# Patient Record
Sex: Male | Born: 1965
Health system: Southern US, Community
[De-identification: ages and names within clinical notes are randomized; demographics above are authoritative.]

## PROBLEM LIST (undated history)

## (undated) DIAGNOSIS — B019 Varicella without complication: Secondary | ICD-10-CM

## (undated) DIAGNOSIS — L03317 Cellulitis of buttock: Secondary | ICD-10-CM

## (undated) DIAGNOSIS — L0231 Cutaneous abscess of buttock: Secondary | ICD-10-CM

## (undated) DIAGNOSIS — J42 Unspecified chronic bronchitis: Secondary | ICD-10-CM

## (undated) HISTORY — DX: Varicella without complication: B01.9

---
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
## 2014-09-28 HISTORY — PX: UMBILICAL HERNIA REPAIR: SHX196

## 2018-01-10 ENCOUNTER — Inpatient Hospital Stay (HOSPITAL_COMMUNITY)
Admission: EM | Admit: 2018-01-10 | Discharge: 2018-01-13 | DRG: 872 | Disposition: A | Discharge status: Home / Self Care | Payer: BLUE CROSS/BLUE SHIELD | Attending: Internal Medicine | Admitting: Internal Medicine

## 2018-01-10 ENCOUNTER — Ambulatory Visit: Payer: BLUE CROSS/BLUE SHIELD | Admitting: Family Medicine

## 2018-01-10 ENCOUNTER — Emergency Department (HOSPITAL_COMMUNITY): Payer: BLUE CROSS/BLUE SHIELD

## 2018-01-10 ENCOUNTER — Encounter: Payer: Self-pay | Admitting: Family Medicine

## 2018-01-10 ENCOUNTER — Encounter (HOSPITAL_COMMUNITY): Payer: Self-pay | Admitting: Emergency Medicine

## 2018-01-10 ENCOUNTER — Other Ambulatory Visit: Payer: Self-pay

## 2018-01-10 VITALS — BP 134/90 | HR 115 | Temp 98.3°F | Ht 67.0 in | Wt 253.0 lb

## 2018-01-10 DIAGNOSIS — A419 Sepsis, unspecified organism: Secondary | ICD-10-CM | POA: Diagnosis not present

## 2018-01-10 DIAGNOSIS — Z87891 Personal history of nicotine dependence: Secondary | ICD-10-CM

## 2018-01-10 DIAGNOSIS — Z7689 Persons encountering health services in other specified circumstances: Secondary | ICD-10-CM | POA: Diagnosis not present

## 2018-01-10 DIAGNOSIS — R000 Tachycardia, unspecified: Secondary | ICD-10-CM | POA: Diagnosis present

## 2018-01-10 DIAGNOSIS — Z8261 Family history of arthritis: Secondary | ICD-10-CM

## 2018-01-10 DIAGNOSIS — L03317 Cellulitis of buttock: Secondary | ICD-10-CM | POA: Diagnosis not present

## 2018-01-10 DIAGNOSIS — D649 Anemia, unspecified: Secondary | ICD-10-CM

## 2018-01-10 DIAGNOSIS — L0231 Cutaneous abscess of buttock: Secondary | ICD-10-CM

## 2018-01-10 DIAGNOSIS — Z6841 Body Mass Index (BMI) 40.0 and over, adult: Secondary | ICD-10-CM

## 2018-01-10 DIAGNOSIS — R739 Hyperglycemia, unspecified: Secondary | ICD-10-CM | POA: Diagnosis present

## 2018-01-10 DIAGNOSIS — Z713 Dietary counseling and surveillance: Secondary | ICD-10-CM

## 2018-01-10 DIAGNOSIS — Z833 Family history of diabetes mellitus: Secondary | ICD-10-CM

## 2018-01-10 HISTORY — DX: Cellulitis of buttock: L03.317

## 2018-01-10 HISTORY — DX: Unspecified chronic bronchitis: J42

## 2018-01-10 HISTORY — DX: Cutaneous abscess of buttock: L02.31

## 2018-01-10 LAB — CBC WITH DIFFERENTIAL/PLATELET
Band Neutrophils: 2 %
Basophils Absolute: 0 10*3/uL (ref 0.0–0.1)
Basophils Relative: 0 %
Blasts: 0 %
Eosinophils Absolute: 0 10*3/uL (ref 0.0–0.7)
Eosinophils Relative: 0 %
HCT: 46.8 % (ref 39.0–52.0)
Hemoglobin: 16.2 g/dL (ref 13.0–17.0)
Lymphocytes Relative: 19 %
Lymphs Abs: 2.7 10*3/uL (ref 0.7–4.0)
MCH: 30.3 pg (ref 26.0–34.0)
MCHC: 34.6 g/dL (ref 30.0–36.0)
MCV: 87.6 fL (ref 78.0–100.0)
Metamyelocytes Relative: 0 %
Monocytes Absolute: 1.3 10*3/uL — ABNORMAL HIGH (ref 0.1–1.0)
Monocytes Relative: 9 %
Myelocytes: 0 %
Neutro Abs: 10.4 10*3/uL — ABNORMAL HIGH (ref 1.7–7.7)
Neutrophils Relative %: 70 %
Other: 0 %
Platelets: 203 10*3/uL (ref 150–400)
Promyelocytes Relative: 0 %
RBC: 5.34 MIL/uL (ref 4.22–5.81)
RDW: 13.4 % (ref 11.5–15.5)
WBC: 14.4 10*3/uL — ABNORMAL HIGH (ref 4.0–10.5)
nRBC: 0 /100 WBC

## 2018-01-10 LAB — I-STAT CG4 LACTIC ACID, ED
Lactic Acid, Venous: 1.92 mmol/L — ABNORMAL HIGH (ref 0.5–1.9)
Lactic Acid, Venous: 1.35 mmol/L (ref 0.5–1.9)

## 2018-01-10 LAB — BASIC METABOLIC PANEL
Anion gap: 13 (ref 5–15)
BUN: 15 mg/dL (ref 6–20)
CO2: 21 mmol/L — ABNORMAL LOW (ref 22–32)
Calcium: 9.2 mg/dL (ref 8.9–10.3)
Chloride: 101 mmol/L (ref 101–111)
Creatinine, Ser: 0.83 mg/dL (ref 0.61–1.24)
GFR calc Af Amer: >60 mL/min (ref >60)
GFR calc non Af Amer: >60 mL/min (ref >60)
Glucose, Bld: 89 mg/dL (ref 65–99)
Potassium: 3.9 mmol/L (ref 3.5–5.1)
Sodium: 135 mmol/L (ref 135–145)

## 2018-01-10 MED ORDER — SODIUM CHLORIDE 0.9 % IV BOLUS
2000.0000 mL | Freq: Once | INTRAVENOUS | Status: AC
  Administered 2018-01-11: 1000 mL via INTRAVENOUS

## 2018-01-10 MED ORDER — LIDOCAINE-EPINEPHRINE (PF) 2 %-1:200000 IJ SOLN
20.0000 mL | Freq: Once | INTRAMUSCULAR | Status: AC
  Administered 2018-01-10: 20 mL
  Filled 2018-01-10: qty 20

## 2018-01-10 MED ORDER — MORPHINE SULFATE (PF) 4 MG/ML IV SOLN
4.0000 mg | Freq: Once | INTRAVENOUS | Status: AC
  Administered 2018-01-10: 4 mg via INTRAVENOUS
  Filled 2018-01-10: qty 1

## 2018-01-10 MED ORDER — OXYCODONE-ACETAMINOPHEN 5-325 MG PO TABS
1.0000 | ORAL_TABLET | ORAL | Status: DC | PRN
  Administered 2018-01-10: 1 via ORAL
  Filled 2018-01-10: qty 1

## 2018-01-10 MED ORDER — CEFTRIAXONE SODIUM 1 G IJ SOLR
1.0000 g | Freq: Once | INTRAMUSCULAR | Status: AC
  Administered 2018-01-10: 1 g via INTRAMUSCULAR

## 2018-01-10 MED ORDER — AMOXICILLIN-POT CLAVULANATE 875-125 MG PO TABS: 1.0000 | ORAL_TABLET | Freq: Two times a day (BID) | ORAL | 0 refills | Status: DC

## 2018-01-10 MED ORDER — IOPAMIDOL (ISOVUE-300) INJECTION 61%
100.0000 mL | Freq: Once | INTRAVENOUS | Status: AC | PRN
  Administered 2018-01-10: 100 mL via INTRAVENOUS

## 2018-01-10 MED ORDER — IOPAMIDOL (ISOVUE-300) INJECTION 61%
INTRAVENOUS | Status: AC
  Filled 2018-01-10: qty 100

## 2018-01-10 MED ORDER — SODIUM CHLORIDE 0.9 % IV BOLUS
1000.0000 mL | Freq: Once | INTRAVENOUS | Status: AC
  Administered 2018-01-10: 1000 mL via INTRAVENOUS

## 2018-01-10 MED ORDER — SODIUM CHLORIDE 0.9 % IV SOLN
INTRAVENOUS | Status: AC
  Administered 2018-01-11 – 2018-01-12 (×3): via INTRAVENOUS

## 2018-01-10 MED ORDER — VANCOMYCIN HCL 10 G IV SOLR
2000.0000 mg | Freq: Once | INTRAVENOUS | Status: AC
  Administered 2018-01-11: 2000 mg via INTRAVENOUS
  Filled 2018-01-10: qty 2000

## 2018-01-10 MED ORDER — HYDROCODONE-ACETAMINOPHEN 10-325 MG PO TABS: 1.0000 | ORAL_TABLET | Freq: Three times a day (TID) | ORAL | 0 refills | Status: DC | PRN

## 2018-01-10 NOTE — ED Provider Notes (Signed)
[facility] Provider Note   CSN: [id number] Arrival date & time: 01/10/18  1122     History   Chief Complaint Chief Complaint  Patient presents with  . Abscess    HPI Charles Moody is a 52 y.o. male.  The history is provided by the patient and medical records. No language interpreter was used.  Abscess  Associated symptoms: fever (Subjective)   Associated symptoms: no nausea and no vomiting    Charles Moody is a 52 y.o. male  with no pertinent PMH who presents to the Emergency Department at recommendation of PCP for abscess to right gluteal cleft.  Patient reports that he felt a pain and fullness to the area about 4 days ago.  This progressively worsened.  He started noticing some drainage last night.  He went to his primary care doctor who told them this likely would need to be lanced but looked very bad.  They tried to get him to see a surgeon today, but were unsuccessful, therefore told him to come to the emergency department for urgent workup.  He denies any objective fever, but does state that he has felt feverish.  Afebrile at PCP and in ED today.  Denies history of similar.  Denies immunocompromise state/diabetes.  Denies history of similar. No n/v. Pain worse with pressure and sitting on the area.   Past Medical History:  Diagnosis Date  . Chicken pox     Patient Active Problem List   Diagnosis Date Noted  . Abscess and cellulitis of gluteal region 01/10/2018  . Sepsis ([place]) 01/10/2018    Past Surgical History:  Procedure Laterality Date  . APPENDECTOMY  1972  . UMBILICAL HERNIA REPAIR  2016        Home Medications    Prior to Admission medications   Medication Sig Start Date End Date Taking? Authorizing Provider  amoxicillin-clavulanate (AUGMENTIN) 875-125 MG tablet Take 1 tablet by mouth 2 (two) times daily. Patient not taking: Reported on 01/10/2018 01/10/18   Howard Pouch A, DO  HYDROcodone-acetaminophen (NORCO)  10-325 MG tablet Take 1 tablet by mouth every 8 (eight) hours as needed. Patient not taking: Reported on 01/10/2018 01/10/18   Ma Hillock, DO    Family History Family History  Problem Relation Age of Onset  . Arthritis Mother   . Diabetes Mother   . Arthritis Father     Social History Social History   Tobacco Use  . Smoking status: Former Research scientist (life sciences)  . Smokeless tobacco: Never Used  . Tobacco comment: quit 2 years ago  Substance Use Topics  . Alcohol use: Yes    Alcohol/week: 1.8 oz    Types: 3 Cans of beer per week    Comment: 3 cans in a month  . Drug use: Never     Allergies   Patient has no known allergies.   Review of Systems Review of Systems  Constitutional: Positive for chills and fever (Subjective).  Gastrointestinal: Negative for abdominal pain, nausea and vomiting.  Skin: Positive for wound (Abscess).  All other systems reviewed and are negative.    Physical Exam Updated Vital Signs BP 130/74 (BP Location: Right Arm)   Pulse 95   Temp 99.1 F (37.3 C) (Oral)   Resp 18   Ht 5\' 7"  (1.702 m)   Wt 114.8 kg (253 lb)   SpO2 98%   BMI 39.63 kg/m   Physical Exam  Constitutional: He is oriented to person, place, and time. He appears  well-developed and well-nourished. No distress.  HENT:  Head: Normocephalic and atraumatic.  Cardiovascular: Normal heart sounds.  No murmur heard. Tachycardic but regular.  Pulmonary/Chest: Effort normal and breath sounds normal. No respiratory distress.  Abdominal: Soft. He exhibits no distension. There is no tenderness.  Musculoskeletal: Normal range of motion.  Neurological: He is alert and oriented to person, place, and time.  Skin: Skin is warm and dry.  Abscess to the right gluteal cleft which is tender to palpation and actively seeping purulent drainage.  Surrounding erythema and induration which does extend towards the perirectal area.  Nursing note and vitals reviewed.    ED Treatments / Results   Labs (all labs ordered are listed, but only abnormal results are displayed) Labs Reviewed  CBC WITH DIFFERENTIAL/PLATELET - Abnormal; Notable for the following components:      Result Value   WBC 14.4 (*)    Neutro Abs 10.4 (*)    Monocytes Absolute 1.3 (*)    All other components within normal limits  BASIC METABOLIC PANEL - Abnormal; Notable for the following components:   CO2 21 (*)    All other components within normal limits  I-STAT CG4 LACTIC ACID, ED - Abnormal; Notable for the following components:   Lactic Acid, Venous 1.92 (*)    All other components within normal limits  CULTURE, BLOOD (ROUTINE X 2)  CULTURE, BLOOD (ROUTINE X 2)  SEDIMENTATION RATE  C-REACTIVE PROTEIN  HEMOGLOBIN A1C  LACTIC ACID, PLASMA  PROCALCITONIN  HIV ANTIBODY (ROUTINE TESTING)  BASIC METABOLIC PANEL  CBC  I-STAT CG4 LACTIC ACID, ED  I-STAT CG4 LACTIC ACID, ED    EKG None  Radiology Ct Pelvis W Contrast  Result Date: 01/10/2018 CLINICAL DATA:  Assess perirectal abscess at the right gluteal cleft. EXAM: CT PELVIS WITH CONTRAST TECHNIQUE: Multidetector CT imaging of the pelvis was performed using the standard protocol following the bolus administration of intravenous contrast. CONTRAST:  159mL ISOVUE-300 IOPAMIDOL (ISOVUE-300) INJECTION 61% COMPARISON:  None. FINDINGS: Urinary Tract: There is slight wall thickening at the right side of the base of the bladder. The bladder is otherwise unremarkable. Bowel: Visualized small and large bowel loops are grossly unremarkable in appearance. Vascular/Lymphatic: Scattered calcification is seen along the distal abdominal aorta and its branches. The abdominal aorta is otherwise grossly unremarkable. The inferior vena cava is grossly unremarkable. No retroperitoneal lymphadenopathy is seen. No pelvic sidewall lymphadenopathy is identified. Reproductive:  The prostate remains normal in size. Other: There is asymmetric soft tissue inflammation at the right side  of the gluteal cleft, with subcutaneous soft tissue edema tracking along the right buttocks. No well defined abscess is seen at this time. Musculoskeletal: No acute osseous abnormalities are identified. Vacuum phenomenon and facet disease is noted at the lower lumbar spine. The visualized musculature is unremarkable in appearance. IMPRESSION: 1. Asymmetric soft tissue inflammation at the right side of the gluteal cleft, with subcutaneous soft tissue edema tracking along the right buttocks. No well-defined abscess seen at this time. 2. Slight wall thickening at the right side of the base of the bladder. This could reflect debris. Cystoscopy could be considered to exclude underlying mass. Aortic Atherosclerosis (ICD10-I70.0). Electronically Signed   By: Garald Balding M.D.   On: 01/10/2018 23:13   Dg Chest Port 1 View  Result Date: 01/11/2018 CLINICAL DATA:  Sepsis EXAM: PORTABLE CHEST 1 VIEW COMPARISON:  None. FINDINGS: Interstitial opacities at the bases. No pleural effusion. No consolidation. Normal heart size. No pneumothorax. IMPRESSION: Increased interstitial opacity  at the bases consistent with bronchitic changes, uncertain chronicity. Electronically Signed   By: Donavan Foil M.D.   On: 01/11/2018 00:24    Procedures .Marland KitchenIncision and Drainage Date/Time: 01/11/2018 12:18 AM Performed by: Ward, Ozella Almond, PA-C Authorized by: Ward, Ozella Almond, PA-C   Consent:    Consent obtained:  Verbal   Consent given by:  Patient   Risks discussed:  Bleeding, incomplete drainage, pain and infection Location:    Type:  Abscess Pre-procedure details:    Skin preparation:  Betadine Anesthesia (see MAR for exact dosages):    Anesthesia method:  Local infiltration   Local anesthetic:  Lidocaine 2% WITH epi Procedure type:    Complexity:  Complex Procedure details:    Incision types:  Single straight   Scalpel blade:  11   Wound management:  Probed and deloculated   Drainage:  Purulent   Drainage  amount:  Scant   Wound treatment:  Wound left open   Packing materials:  1/4 in gauze Post-procedure details:    Patient tolerance of procedure:  Tolerated well, no immediate complications   (including critical care time)  Medications Ordered in ED Medications  vancomycin (VANCOCIN) 2,000 mg in sodium chloride 0.9 % 500 mL IVPB (2,000 mg Intravenous New Bag/Given 01/11/18 0000)  sodium chloride 0.9 % bolus 2,000 mL (1,000 mLs Intravenous New Bag/Given 01/11/18 0004)  0.9 %  sodium chloride infusion (has no administration in time range)  oxyCODONE-acetaminophen (PERCOCET/ROXICET) 5-325 MG per tablet 1 tablet (has no administration in time range)  morphine 4 MG/ML injection 2 mg (has no administration in time range)  heparin injection 5,000 Units (has no administration in time range)  acetaminophen (TYLENOL) tablet 650 mg (has no administration in time range)    Or  acetaminophen (TYLENOL) suppository 650 mg (has no administration in time range)  ondansetron (ZOFRAN) tablet 4 mg (has no administration in time range)    Or  ondansetron (ZOFRAN) injection 4 mg (has no administration in time range)  senna-docusate (Senokot-S) tablet 1 tablet (has no administration in time range)  metroNIDAZOLE (FLAGYL) tablet 500 mg (has no administration in time range)  vancomycin (VANCOCIN) IVPB 1000 mg/200 mL premix (has no administration in time range)  morphine 4 MG/ML injection 4 mg (4 mg Intravenous Given 01/10/18 2314)  sodium chloride 0.9 % bolus 1,000 mL (0 mLs Intravenous Stopped 01/11/18 0027)  iopamidol (ISOVUE-300) 61 % injection 100 mL (100 mLs Intravenous Contrast Given 01/10/18 2252)  lidocaine-EPINEPHrine (XYLOCAINE W/EPI) 2 %-1:200000 (PF) injection 20 mL (20 mLs Infiltration Given 01/10/18 2354)     Initial Impression / Assessment and Plan / ED Course  I have reviewed the triage vital signs and the nursing notes.  Pertinent labs & imaging results that were available during my care of the  patient were reviewed by me and considered in my medical decision making (see chart for details).    Marcial Pless is a 52 y.o. male who presents to ED from PCP for abscess to right gluteal cleft. Tachycardic upon arrival. Lactic mildly elevated at 1.92, leukocytosis of 14.4. He does have area of fluctuance which is actively draining. Bedside I&D was performed with small amount of drainage. Given amount of induration and extension toward perirectal area, CT was obtained. Imaging reviewed showing findings c/w cellulitis. No abscess seen. Blood cx's obtained. Started on vancomycin. Hospitalist consulted who will admit.    Patient seen by and discussed with Dr. Jeanell Sparrow who agrees with treatment plan.    Final Clinical  Impressions(s) / ED Diagnoses   Final diagnoses:  Sepsis ([place])  Cellulitis of buttock    ED Discharge Orders    None       Ward, Ozella Almond, PA-C 01/11/18 0031    Pattricia Boss, MD 01/11/18 [phone]

## 2018-01-10 NOTE — ED Triage Notes (Signed)
Patient sent to the ED for skin abscess on buttocks. Seen at [facility] primary care and given injection rocephin. Patient states needs to be drained today.

## 2018-01-10 NOTE — ED Notes (Signed)
Provider at bedside in triage to verify orders and assess patient. States suppose to go to [facility] however they were full and came to the ED.

## 2018-01-10 NOTE — Patient Instructions (Addendum)
-   Start Augmentin this evening. Injection provided today of rocephin (antibiotic) - Vicodin prescribed for acute pain. - If surgery unable to get you in today, you will need to go to Emergency room ASAP.    - please make an appt for your physcial with fasting labs within 2 months.    Skin Abscess A skin abscess is an infected area on or under your skin that contains pus and other material. An abscess can happen almost anywhere on your body. Some abscesses break open (rupture) on their own. Most continue to get worse unless they are treated. The infection can spread deeper into the body and into your blood, which can make you feel sick. Treatment usually involves draining the abscess. Follow these instructions at home: Abscess Care  If you have an abscess that has not drained, place a warm, clean, wet washcloth over the abscess several times a day. Do this as told by your doctor.  Follow instructions from your doctor about how to take care of your abscess. Make sure you: ? Cover the abscess with a bandage (dressing). ? Change your bandage or gauze as told by your doctor. ? Wash your hands with soap and water before you change the bandage or gauze. If you cannot use soap and water, use hand sanitizer.  Check your abscess every day for signs that the infection is getting worse. Check for: ? More redness, swelling, or pain. ? More fluid or blood. ? Warmth. ? More pus or a bad smell. Medicines   Take over-the-counter and prescription medicines only as told by your doctor.  If you were prescribed an antibiotic medicine, take it as told by your doctor. Do not stop taking the antibiotic even if you start to feel better. General instructions  To avoid spreading the infection: ? Do not share personal care items, towels, or hot tubs with others. ? Avoid making skin-to-skin contact with other people.  Keep all follow-up visits as told by your doctor. This is important. Contact a doctor  if:  You have more redness, swelling, or pain around your abscess.  You have more fluid or blood coming from your abscess.  Your abscess feels warm when you touch it.  You have more pus or a bad smell coming from your abscess.  You have a fever.  Your muscles ache.  You have chills.  You feel sick. Get help right away if:  You have very bad (severe) pain.  You see red streaks on your skin spreading away from the abscess. This information is not intended to replace advice given to you by your health care provider. Make sure you discuss any questions you have with your health care provider. Document Released: 03/02/2008 Document Revised: 05/10/2016 Document Reviewed: 07/24/2015 Elsevier Interactive Patient Education  [employer].

## 2018-01-10 NOTE — Progress Notes (Signed)
Patient ID: Charles Moody, male  DOB: Aug 02, 1966, 52 y.o.   MRN: [id number] Patient Care Team    Relationship Specialty Notifications Start End  Ma Hillock, DO PCP - General Family Medicine  01/10/18     Chief Complaint  Patient presents with  . Establish Care    Pt c/o boil on his tail bone.    Subjective:  Charles Moody is a 52 y.o.  male present for new patient establishment. All past medical history, surgical history, allergies, family history, immunizations, medications and social history were obtained in the electronic medical record today. All recent labs, ED visits and hospitalizations within the last year were reviewed.  Abscess: pt presents today for new pt establishment with reports of large right buttocks abscess. He reports he has other ones before but this one became very painful and large fast. He reports feeling chilled and feverish over the weekend, but never had a fever. He is eating and drinking ok, no nausea or vomit. The area is draining a small amount yellow fluid. He is unable to sit or lay on that side. It is extremely painful.     Depression screen [facility] 2/9 01/10/2018  Decreased Interest 0  Down, Depressed, Hopeless 0  PHQ - 2 Score 0   No flowsheet data found.    Current Exercise Habits: The patient does not participate in regular exercise at present   Fall Risk  01/10/2018  Falls in the past year? No      There is no immunization history on file for this patient.  No exam data present  Past Medical History:  Diagnosis Date  . Chicken pox    Not on File Past Surgical History:  Procedure Laterality Date  . APPENDECTOMY  1972  . UMBILICAL HERNIA REPAIR  2016   Family History  Problem Relation Age of Onset  . Arthritis Mother   . Diabetes Mother   . Arthritis Father    Social History   Socioeconomic History  . Marital status: Single    Spouse name: Not on file  . Number of children: Not on file  . Years of education: Not  on file  . Highest education level: Not on file  Occupational History  . Not on file  Social Needs  . Financial resource strain: Not on file  . Food insecurity:    Worry: Not on file    Inability: Not on file  . Transportation needs:    Medical: Not on file    Non-medical: Not on file  Tobacco Use  . Smoking status: Former Research scientist (life sciences)  . Smokeless tobacco: Never Used  . Tobacco comment: quit 2 years ago  Substance and Sexual Activity  . Alcohol use: Yes    Alcohol/week: 1.8 oz    Types: 3 Cans of beer per week    Comment: 3 cans in a month  . Drug use: Never  . Sexual activity: Yes    Partners: Female  Lifestyle  . Physical activity:    Days per week: Not on file    Minutes per session: Not on file  . Stress: Not on file  Relationships  . Social connections:    Talks on phone: Not on file    Gets together: Not on file    Attends religious service: Not on file    Active member of club or organization: Not on file    Attends meetings of clubs or organizations: Not on file    Relationship  status: Not on file  . Intimate partner violence:    Fear of current or ex partner: Not on file    Emotionally abused: Not on file    Physically abused: Not on file    Forced sexual activity: Not on file  Other Topics Concern  . Not on file  Social History Narrative   Single. HS graduate. Works as a Administrator.    Former smoker.    Smoke alarm in the home.    Wears his seatbelt.    Feels safe in his relationships.   Allergies as of 01/10/2018   Not on File     Medication List        Accurate as of 01/10/18  8:47 AM. Always use your most recent med list.          amoxicillin-clavulanate 875-125 MG tablet Commonly known as:  AUGMENTIN Take 1 tablet by mouth 2 (two) times daily.   HYDROcodone-acetaminophen 10-325 MG tablet Commonly known as:  NORCO Take 1 tablet by mouth every 8 (eight) hours as needed.       All past medical history, surgical history, allergies, family  history, immunizations andmedications were updated in the EMR today and reviewed under the history and medication portions of their EMR.    No results found for this or any previous visit (from the past 2160 hour(s)).  Patient was never admitted.   ROS: 14 pt review of systems performed and negative (unless mentioned in an HPI)  Objective: BP 134/90 (BP Location: Left Arm, Patient Position: Sitting, Cuff Size: Large)   Pulse (!) 115   Temp 98.3 F (36.8 C) (Oral)   Ht 5\' 7"  (1.702 m)   Wt 253 lb (114.8 kg)   SpO2 95%   BMI 39.63 kg/m  Gen: Afebrile. Nontoxic in appearance, well-developed, well-nourished,  Appears in pain. Leaning to side.  HENT: AT. [place].  MMM, no oral lesions, adequate dentition. Eyes:Pupils Equal Round Reactive to light, Extraocular movements intact,  Conjunctiva without redness, discharge or icterus. CV: Tachycardic , no edema Chest: CTAB, no wheeze, rhonchi or crackles.  Abd: Soft. obese. BS present.. Skin/buttocks: 3.5 in area of erythema, fluctuant center with indurated area of 2 in. Extremely tender.  Neuro/Msk: walking with a limp. PERLA. EOMi. Alert. Oriented x3.   Psych: Normal affect, dress and demeanor. Normal speech. Normal thought content and judgment.   Assessment/plan: Allan Minotti is a 52 y.o. male present for establishment of care with large buttock abscess.  Abscess and cellulitis of gluteal region - Urgent referral to [facility] to be worked into [facility] reviewed and appropriate. 5 days of pain medication provided.   - Vicodin 10-325 prescribed q 8 hr prn - Rocephin 1 gm IM in the office today.  - If [facility] unable to see today, Pt instructed to go to ED . - Ambulatory referral to General Surgery- URGENT - F/U 1 PRN.   Return in about 1 week (around 01/17/2018), or if symptoms worsen or fail to improve.  - Pt encouraged to make an appt for CPE within 2 months.   Note is dictated utilizing voice recognition software. Although note has  been proof read prior to signing, occasional typographical errors still can be missed. If any questions arise, please do not hesitate to call for verification.  Electronically signed by: Howard Pouch, DO [place]

## 2018-01-10 NOTE — H&P (Signed)
History and Physical    Charles Moody HEN:[id number] DOB: 05/30/1966 DOA: 01/10/2018  Referring MD/NP/PA:   PCP: Ma Hillock, DO   Patient coming from:  The patient is coming from home.  At baseline, pt is independent for most of ADL.  Chief Complaint: pain in right buttocks   HPI: Charles Moody is a 52 y.o. male without significant medical history, who presents with pain in the right buttocks.   Patient reports that he has been having pain and fullness to right gluteal cleft for 4 days,which has been progressively getting worse. He started noticing some drainage last night. He does not have fever, but has chills. He was seen by PCP and diagnosed him with abscess. They tried to get him to see a surgeon today, but were unsuccessful, therefore told him to come to ED for urgent workup. Patient does not have chest pain, shortness breath, cough, nausea, vomiting, diarrhea, abdominal pain, symptoms of UTI. No history of diabetes.  ED Course: pt was found to have WBC 14.4, lactic acid 1.92, 1.35, electrolytes renal function okay, temperature 99.6, tachycardia, tachypnea, O2 sat 97% on room air. CXR showed increased interstitial opacity at the bases consistent with bronchitic changes. Patient is admitted to [facility] bed as inpatient.  CT-Pelvis with contrast showed: 1. Asymmetric soft tissue inflammation at the right side of the gluteal cleft, with subcutaneous soft tissue edema tracking along the right buttocks. No well-defined abscess seen at this time. 2. Slight wall thickening at the right side of the base of the bladder. This could reflect debris. Cystoscopy could be considered to exclude underlying mass.   Review of Systems:   General: no fevers, has chills, no body weight gain, has fatigue HEENT: no blurry vision, hearing changes or sore throat Respiratory: no dyspnea, coughing, wheezing CV: no chest pain, no palpitations GI: no nausea, vomiting, abdominal pain, diarrhea,  constipation GU: no dysuria, burning on urination, increased urinary frequency, hematuria  Ext: no leg edema Neuro: no unilateral weakness, numbness, or tingling, no vision change or hearing loss Skin: has draining and pain in right gluteal cleft area. MSK: No muscle spasm, no deformity, no limitation of range of movement in spin Heme: No easy bruising.  Travel history: No recent long distant travel.  Allergy: No Known Allergies  Past Medical History:  Diagnosis Date  . Chicken pox     Past Surgical History:  Procedure Laterality Date  . APPENDECTOMY  1972  . UMBILICAL HERNIA REPAIR  2016    Social History:  reports that he has quit smoking. He has never used smokeless tobacco. He reports that he drinks about 1.8 oz of alcohol per week. He reports that he does not use drugs.  Family History:  Family History  Problem Relation Age of Onset  . Arthritis Mother   . Diabetes Mother   . Arthritis Father      Prior to Admission medications   Medication Sig Start Date End Date Taking? Authorizing Provider  amoxicillin-clavulanate (AUGMENTIN) 875-125 MG tablet Take 1 tablet by mouth 2 (two) times daily. Patient not taking: Reported on 01/10/2018 01/10/18   Howard Pouch A, DO  HYDROcodone-acetaminophen (NORCO) 10-325 MG tablet Take 1 tablet by mouth every 8 (eight) hours as needed. Patient not taking: Reported on 01/10/2018 01/10/18   Ma Hillock, DO    Physical Exam: Vitals:   01/10/18 2316 01/10/18 2348 01/10/18 2355 01/11/18 0030  BP: (!) 124/95  130/74 (!) 144/80  Pulse: 95  95 89  Resp:  16  18 (!) 29  Temp: 99.1 F (37.3 C)     TempSrc: Oral     SpO2: 97%  98% 96%  Weight:  114.8 kg (253 lb)    Height:  '5\' 7"'$  (1.702 m)     General: Not in acute distress HEENT:       Eyes: PERRL, EOMI, no scleral icterus.       ENT: No discharge from the ears and nose, no pharynx injection, no tonsillar enlargement.        Neck: No JVD, no bruit, no mass felt. Heme: No neck lymph  node enlargement. Cardiac: S1/S2, RRR, No murmurs, No gallops or rubs. Respiratory: pain, no organomegaly, BS present. GU: No hematuria Ext: No pitting leg edema bilaterally. 2+DP/PT pulse bilaterally. Musculoskeletal: No joint deformities, No joint redness or warmth, no limitation of ROM in spin. Skin: has purulent draining from right gluteal cleft, with erythema,induration, tenderness in his surroundings. Neuro: Alert, oriented X3, cranial nerves II-XII grossly intact, moves all extremities normally. Psych: Patient is not psychotic, no suicidal or hemocidal ideation.  Labs on Admission: I have personally reviewed following labs and imaging studies  CBC: Recent Labs  Lab 01/10/18 1241  WBC 14.4*  NEUTROABS 10.4*  HGB 16.2  HCT 46.8  MCV 87.6  PLT 030   Basic Metabolic Panel: Recent Labs  Lab 01/10/18 1242  NA 135  K 3.9  CL 101  CO2 21*  GLUCOSE 89  BUN 15  CREATININE 0.83  CALCIUM 9.2   GFR: Estimated Creatinine Clearance: 127.5 mL/min (by C-G formula based on SCr of 0.83 mg/dL). Liver Function Tests: No results for input(s): AST, ALT, ALKPHOS, BILITOT, PROT, ALBUMIN in the last 168 hours. No results for input(s): LIPASE, AMYLASE in the last 168 hours. No results for input(s): AMMONIA in the last 168 hours. Coagulation Profile: No results for input(s): INR, PROTIME in the last 168 hours. Cardiac Enzymes: No results for input(s): CKTOTAL, CKMB, CKMBINDEX, TROPONINI in the last 168 hours. BNP (last 3 results) No results for input(s): PROBNP in the last 8760 hours. HbA1C: No results for input(s): HGBA1C in the last 72 hours. CBG: No results for input(s): GLUCAP in the last 168 hours. Lipid Profile: No results for input(s): CHOL, HDL, LDLCALC, TRIG, CHOLHDL, LDLDIRECT in the last 72 hours. Thyroid Function Tests: No results for input(s): TSH, T4TOTAL, FREET4, T3FREE, THYROIDAB in the last 72 hours. Anemia Panel: No results for input(s): VITAMINB12, FOLATE,  FERRITIN, TIBC, IRON, RETICCTPCT in the last 72 hours. Urine analysis: No results found for: COLORURINE, APPEARANCEUR, LABSPEC, PHURINE, GLUCOSEU, HGBUR, BILIRUBINUR, KETONESUR, PROTEINUR, UROBILINOGEN, NITRITE, LEUKOCYTESUR Sepsis Labs: '@LABRCNTIP'$ (procalcitonin:4,lacticidven:4) )No results found for this or any previous visit (from the past 240 hour(s)).   Radiological Exams on Admission: Ct Pelvis W Contrast  Result Date: 01/10/2018 CLINICAL DATA:  Assess perirectal abscess at the right gluteal cleft. EXAM: CT PELVIS WITH CONTRAST TECHNIQUE: Multidetector CT imaging of the pelvis was performed using the standard protocol following the bolus administration of intravenous contrast. CONTRAST:  164m ISOVUE-300 IOPAMIDOL (ISOVUE-300) INJECTION 61% COMPARISON:  None. FINDINGS: Urinary Tract: There is slight wall thickening at the right side of the base of the bladder. The bladder is otherwise unremarkable. Bowel: Visualized small and large bowel loops are grossly unremarkable in appearance. Vascular/Lymphatic: Scattered calcification is seen along the distal abdominal aorta and its branches. The abdominal aorta is otherwise grossly unremarkable. The inferior vena cava is grossly unremarkable. No retroperitoneal lymphadenopathy is seen. No pelvic sidewall lymphadenopathy is identified.  Reproductive:  The prostate remains normal in size. Other: There is asymmetric soft tissue inflammation at the right side of the gluteal cleft, with subcutaneous soft tissue edema tracking along the right buttocks. No well defined abscess is seen at this time. Musculoskeletal: No acute osseous abnormalities are identified. Vacuum phenomenon and facet disease is noted at the lower lumbar spine. The visualized musculature is unremarkable in appearance. IMPRESSION: 1. Asymmetric soft tissue inflammation at the right side of the gluteal cleft, with subcutaneous soft tissue edema tracking along the right buttocks. No well-defined  abscess seen at this time. 2. Slight wall thickening at the right side of the base of the bladder. This could reflect debris. Cystoscopy could be considered to exclude underlying mass. Aortic Atherosclerosis (ICD10-I70.0). Electronically Signed   By: Garald Balding M.D.   On: 01/10/2018 23:13   Dg Chest Port 1 View  Result Date: 01/11/2018 CLINICAL DATA:  Sepsis EXAM: PORTABLE CHEST 1 VIEW COMPARISON:  None. FINDINGS: Interstitial opacities at the bases. No pleural effusion. No consolidation. Normal heart size. No pneumothorax. IMPRESSION: Increased interstitial opacity at the bases consistent with bronchitic changes, uncertain chronicity. Electronically Signed   By: Donavan Foil M.D.   On: 01/11/2018 00:24     EKG:  Not done in ED.  Assessment/Plan Principal Problem:   Abscess and cellulitis of gluteal region Active Problems:   Sepsis ([place])   Sepsis due to abscess and cellulitis of gluteal region: patient meet criteria for sepsis with leukocytosis, tachycardia and tachypnea. Lactic acid is normal. Currently hemodynamically stable.  - will admit to med-surg bed as inpt - Empiric antimicrobial treatment with vancomycin and flagyl - PRN Zofran for nausea, morphine and Percocet for pain - Blood cultures x 2  - ESR and CRP - wound care consult - will get Procalcitonin and trend lactic acid levels per sepsis protocol. - IVF: 3L L of NS bolus in ED, followed by 125 cc/h   DVT ppx: SQ Heparin   Code Status: Full code Family Communication: None at bed side.     Disposition Plan:  Anticipate discharge back to previous home environment Consults called:  none Admission status:  medical floor/inpt    Date of Service 01/11/2018    Ivor Costa [facility] Pager [phone]  If 7PM-7AM, please contact night-coverage [email or web address] Password [facility] 01/11/2018, 12:46 AM

## 2018-01-10 NOTE — ED Notes (Signed)
RN notified that pt's BP was higher than when he came in. Pt also very irritated about the wait time. Told pt that there are 3 people ahead of him that will go back. Noticed that wife was counting when names were called. This tech explained to pt and wife that there are several different areas that patients go to as well as several different reasons names are called. Wife expressed understanding.

## 2018-01-10 NOTE — ED Provider Notes (Signed)
Patient placed in Quick Look pathway, seen and evaluated   Chief Complaint: boil to buttock  HPI:   Pt presenting to ED from PCP with abscess to buttock.  Patient states his primary care attempted tohave [facility] at [facility] today for drainage, however they were full.  He states they treated him with IM Rocephin at PCP and recommended he report here since surgeon was unable to see him.  He states he noticed the "boil"on Wednesday.  Denies systemic symptoms.  Denies history of diabetes or immunocompromise.  ROS: + abscess, - fever  Physical Exam:   Gen: No distress, pt is well-appearing, non-toxic, not in distress  Neuro: Awake and Alert  Skin: Warm    Focused Exam: right gluteal cleft with actively draining abscess with surrounding erythema and induration.   Initiation of care has begun. The patient has been counseled on the process, plan, and necessity for staying for the completion/evaluation, and the remainder of the medical screening examination    Kahlil Cowans, [place] N, PA-C 01/10/18 1245    Macarthur Critchley, MD 01/10/18 1557

## 2018-01-10 NOTE — ED Notes (Signed)
IV access attempted X2 without success. 

## 2018-01-11 ENCOUNTER — Encounter (HOSPITAL_COMMUNITY): Payer: Self-pay | Admitting: General Practice

## 2018-01-11 ENCOUNTER — Inpatient Hospital Stay (HOSPITAL_COMMUNITY): Payer: BLUE CROSS/BLUE SHIELD

## 2018-01-11 ENCOUNTER — Other Ambulatory Visit: Payer: Self-pay

## 2018-01-11 DIAGNOSIS — D649 Anemia, unspecified: Secondary | ICD-10-CM | POA: Diagnosis present

## 2018-01-11 DIAGNOSIS — Z6841 Body Mass Index (BMI) 40.0 and over, adult: Secondary | ICD-10-CM | POA: Diagnosis not present

## 2018-01-11 DIAGNOSIS — R739 Hyperglycemia, unspecified: Secondary | ICD-10-CM

## 2018-01-11 DIAGNOSIS — R000 Tachycardia, unspecified: Secondary | ICD-10-CM | POA: Diagnosis present

## 2018-01-11 DIAGNOSIS — Z713 Dietary counseling and surveillance: Secondary | ICD-10-CM | POA: Diagnosis not present

## 2018-01-11 DIAGNOSIS — Z833 Family history of diabetes mellitus: Secondary | ICD-10-CM | POA: Diagnosis not present

## 2018-01-11 DIAGNOSIS — L03317 Cellulitis of buttock: Secondary | ICD-10-CM | POA: Diagnosis present

## 2018-01-11 DIAGNOSIS — L0231 Cutaneous abscess of buttock: Secondary | ICD-10-CM | POA: Diagnosis present

## 2018-01-11 DIAGNOSIS — A419 Sepsis, unspecified organism: Secondary | ICD-10-CM | POA: Diagnosis present

## 2018-01-11 DIAGNOSIS — Z8261 Family history of arthritis: Secondary | ICD-10-CM | POA: Diagnosis not present

## 2018-01-11 DIAGNOSIS — Z87891 Personal history of nicotine dependence: Secondary | ICD-10-CM | POA: Diagnosis not present

## 2018-01-11 LAB — HEMOGLOBIN A1C
Hgb A1c MFr Bld: 5.5 % (ref 4.8–5.6)
Mean Plasma Glucose: 111.15 mg/dL

## 2018-01-11 LAB — SEDIMENTATION RATE: Sed Rate: 19 mm/hr — ABNORMAL HIGH (ref 0–16)

## 2018-01-11 LAB — C-REACTIVE PROTEIN: CRP: 5.9 mg/dL — ABNORMAL HIGH (ref <1.0)

## 2018-01-11 LAB — CBC
HCT: 37.7 % — ABNORMAL LOW (ref 39.0–52.0)
Hemoglobin: 12.6 g/dL — ABNORMAL LOW (ref 13.0–17.0)
MCH: 29 pg (ref 26.0–34.0)
MCHC: 33.4 g/dL (ref 30.0–36.0)
MCV: 86.7 fL (ref 78.0–100.0)
Platelets: 173 10*3/uL (ref 150–400)
RBC: 4.35 MIL/uL (ref 4.22–5.81)
RDW: 13.1 % (ref 11.5–15.5)
WBC: 10.4 10*3/uL (ref 4.0–10.5)

## 2018-01-11 LAB — BASIC METABOLIC PANEL
Anion gap: 7 (ref 5–15)
BUN: 10 mg/dL (ref 6–20)
CO2: 22 mmol/L (ref 22–32)
Calcium: 7.7 mg/dL — ABNORMAL LOW (ref 8.9–10.3)
Chloride: 109 mmol/L (ref 101–111)
Creatinine, Ser: 0.83 mg/dL (ref 0.61–1.24)
GFR calc Af Amer: >60 mL/min (ref >60)
GFR calc non Af Amer: >60 mL/min (ref >60)
Glucose, Bld: 108 mg/dL — ABNORMAL HIGH (ref 65–99)
Potassium: 3.6 mmol/L (ref 3.5–5.1)
Sodium: 138 mmol/L (ref 135–145)

## 2018-01-11 LAB — PROCALCITONIN: Procalcitonin: <0.1 ng/mL

## 2018-01-11 LAB — HIV ANTIBODY (ROUTINE TESTING W REFLEX): HIV Screen 4th Generation wRfx: NONREACTIVE

## 2018-01-11 LAB — LACTIC ACID, PLASMA: Lactic Acid, Venous: 0.8 mmol/L (ref 0.5–1.9)

## 2018-01-11 MED ORDER — VANCOMYCIN HCL IN DEXTROSE 1-5 GM/200ML-% IV SOLN
1000.0000 mg | Freq: Three times a day (TID) | INTRAVENOUS | Status: DC
  Administered 2018-01-11 – 2018-01-13 (×7): 1000 mg via INTRAVENOUS
  Filled 2018-01-11 (×8): qty 200

## 2018-01-11 MED ORDER — HEPARIN SODIUM (PORCINE) 5000 UNIT/ML IJ SOLN
5000.0000 [IU] | Freq: Three times a day (TID) | INTRAMUSCULAR | Status: DC
  Administered 2018-01-11 – 2018-01-13 (×6): 5000 [IU] via SUBCUTANEOUS
  Filled 2018-01-11 (×6): qty 1

## 2018-01-11 MED ORDER — SENNOSIDES-DOCUSATE SODIUM 8.6-50 MG PO TABS: 1.0000 | ORAL_TABLET | Freq: Every evening | ORAL | Status: DC | PRN

## 2018-01-11 MED ORDER — VANCOMYCIN HCL IN DEXTROSE 1-5 GM/200ML-% IV SOLN: 1000.0000 mg | Freq: Once | INTRAVENOUS | Status: DC

## 2018-01-11 MED ORDER — ACETAMINOPHEN 650 MG RE SUPP: 650.0000 mg | Freq: Four times a day (QID) | RECTAL | Status: DC | PRN

## 2018-01-11 MED ORDER — ONDANSETRON HCL 4 MG/2ML IJ SOLN: 4.0000 mg | Freq: Four times a day (QID) | INTRAMUSCULAR | Status: DC | PRN

## 2018-01-11 MED ORDER — METRONIDAZOLE 500 MG PO TABS
500.0000 mg | ORAL_TABLET | Freq: Three times a day (TID) | ORAL | Status: DC
  Administered 2018-01-11 – 2018-01-12 (×6): 500 mg via ORAL
  Filled 2018-01-11 (×6): qty 1

## 2018-01-11 MED ORDER — MORPHINE SULFATE (PF) 4 MG/ML IV SOLN
2.0000 mg | INTRAVENOUS | Status: DC | PRN
Start: 2018-01-11 — End: 2018-01-12
  Administered 2018-01-11 – 2018-01-12 (×8): 2 mg via INTRAVENOUS
  Filled 2018-01-11 (×8): qty 1

## 2018-01-11 MED ORDER — ACETAMINOPHEN 325 MG PO TABS: 650.0000 mg | ORAL_TABLET | Freq: Four times a day (QID) | ORAL | Status: DC | PRN

## 2018-01-11 MED ORDER — OXYCODONE-ACETAMINOPHEN 5-325 MG PO TABS
1.0000 | ORAL_TABLET | Freq: Four times a day (QID) | ORAL | Status: AC | PRN
  Administered 2018-01-11: 1 via ORAL
  Filled 2018-01-11: qty 1

## 2018-01-11 MED ORDER — ONDANSETRON HCL 4 MG PO TABS: 4.0000 mg | ORAL_TABLET | Freq: Four times a day (QID) | ORAL | Status: DC | PRN

## 2018-01-11 NOTE — Progress Notes (Addendum)
PROGRESS NOTE    Charles Moody  ZMO:[id number] DOB: 12-Jan-1966 DOA: 01/10/2018 PCP: Ma Hillock, DO   Brief Narrative:  Charles Moody is a 52 y.o. male without significant medical history, who presents with pain in the right buttocks.   Patient reports that he has been having pain and fullness to right gluteal cleft for 4 days,which has been progressively getting worse.He started noticing some drainage last night. He does not have fever, but has chills. He was seen by PCP and diagnosed him with abscess. They tried to get him to see a surgeon today, but were unsuccessful, therefore told him to come to ED for urgent workup. Patient does not have chest pain, shortness breath, cough, nausea, vomiting, diarrhea, abdominal pain, symptoms of UTI. No history of diabetes.  In the ED pt was found to have WBC 14.4, lactic acid 1.92, 1.35, electrolytes renal function okay, temperature 99.6, tachycardia, tachypnea, O2 sat 97% on room air. CXR showed increased interstitial opacity at the bases consistent with bronchitic changes. Patient was admitted to [place] bed as inpatient for treatment of Right Buttock Cellulitis and Abscess. Patient had an incision drainage of the abscess in the ED.  Assessment & Plan:   Principal Problem:   Abscess and cellulitis of gluteal region Active Problems:   Sepsis ([name])  Sepsis due to abscess and cellulitis of Right gluteal region:  -patient meet criteria for sepsis with leukocytosis, tachycardia and tachypnea. Lactic acid is normal. Currently hemodynamically stable. -Sepsis physiology has improved as patient is no longer tachycardic, tachypneic, WBC is improved -Admitted to med-surg bed as inpt -Empiric antimicrobial treatment with IV Vancomycin and  p.o. metronidazole 500 mg every 8h -C/w PRN Zofran 4 mg po/IV for Nausea,  -C/w IV Morphine 2 mg every 3 hours for severe pain and Percocet 1 tab p.o. every 6 as needed moderate pain for pain -Blood cultures x 2  showed no growth less than 24 hours.  However only had left blood hand draw - ESR is 19 and CRP is 5.9 -Wound care consult -Procalcitonin level was obtained and was less than 0.10 and Trended lactic acid levels per sepsis protocol lactic acid level went from 1.92 down to 0.8 -WBC went from 14.4 -> 10.4 -GivenIVF: 3L L of NS bolus in ED, followed by 125 cc/h and will reduce rate to 100 mL/hr x15 more hours.  -If not improving consider a Surgical Consultation -Add a donut cushion and send Aerobic/Anerobic Buttock Culture  Normocytic Anemia -Patient's Hb/Hct dropped from 16.2/46.8 -> 12.6/37.7 -Likely a dilutional drop with all IV fluid the patient has received -Check Anemia Panel -Continue to monitor for signs and symptoms of bleeding -Repeat CBC in a.m.  Hyperglycemia -Likely reactive in the setting of sepsis. -We will obtain a hemoglobin A1c  Morbid Obesity -Patient's BMI was 41.33 kg/m2 -Weight Loss Counseling Given  DVT prophylaxis: Heparin 5,000 units sq q8h Code Status: FULL CODE Family Communication: No family present at bedside Disposition Plan: Remain inpatient for current treatment of cellulitis and abscess anticipate discharge in the next 24-48 hours if improved  Consultants:  None   Procedures: None  Antimicrobials: Anti-infectives (From admission, onward)   Start     Dose/Rate Route Frequency Ordered Stop   01/11/18 0600  vancomycin (VANCOCIN) IVPB 1000 mg/200 mL premix     1,000 mg 200 mL/hr over 60 Minutes Intravenous Every 8 hours 01/11/18 0009     01/11/18 0015  vancomycin (VANCOCIN) IVPB 1000 mg/200 mL premix  Status:  Discontinued  1,000 mg 200 mL/hr over 60 Minutes Intravenous  Once 01/11/18 0005 01/11/18 0007   01/11/18 0015  metroNIDAZOLE (FLAGYL) tablet 500 mg     500 mg Oral Every 8 hours 01/11/18 0008     01/10/18 2345  vancomycin (VANCOCIN) 2,000 mg in sodium chloride 0.9 % 500 mL IVPB     2,000 mg 250 mL/hr over 120 Minutes Intravenous  Once  01/10/18 2343 01/11/18 0220     Subjective: Seen and examined at bedside and states he felt okay.  No chest pain, or shortness of breath.  States buttock cleft is still painful but not as much.  No lightheadedness or dizziness.  Objective: Vitals:   01/11/18 0200 01/11/18 0300 01/11/18 0600 01/11/18 0659  BP: 124/67 125/65 134/88 137/80  Pulse: 89 88 76 78  Resp: (!) '22  14 16  '$ Temp:   97.9 F (36.6 C) 97.9 F (36.6 C)  TempSrc:   Oral Oral  SpO2: 96% 98% 97% 98%  Weight:    119.7 kg (263 lb 14.3 oz)  Height:    '5\' 7"'$  (1.702 m)    Intake/Output Summary (Last 24 hours) at 01/11/2018 0852 Last data filed at 01/11/2018 0220 Gross per 24 hour  Intake 6500 ml  Output -  Net 6500 ml   Filed Weights   01/10/18 1226 01/10/18 2348 01/11/18 0659  Weight: 114.8 kg (253 lb) 114.8 kg (253 lb) 119.7 kg (263 lb 14.3 oz)   Examination: Physical Exam:  Constitutional: WN/WD morbidly obese Caucasian male who is currently in NAD and appears calm slightly uncomfortable Eyes: Lids and conjunctivae normal, sclerae anicteric  ENMT: External Ears, Nose appear normal. Grossly normal hearing. Mucous membranes are moist.  Neck: Appears normal, supple, no cervical masses, normal ROM, no appreciable thyromegaly; no JVD Respiratory: Clear to auscultation bilaterally, no wheezing, rales, rhonchi or crackles. Normal respiratory effort and patient is not tachypenic. No accessory muscle use.  Cardiovascular: RRR, no murmurs / rubs / gallops. S1 and S2 auscultated. No extremity edema. Abdomen: Soft, non-tender,Distended due to body habitus. No masses palpated. No appreciable hepatosplenomegaly. Bowel sounds positive.  GU: Deferred. Musculoskeletal: No clubbing / cyanosis of digits/nails. No joint deformity upper and lower extremities. Good ROM, no contractures. Skin: Right gluteal cleft cellulitis and indurated abscess that has been drained.  Firm and painful to palpation and slightly erythematous and warm.   Rest of the skin is warm and dry.   Neurologic: CN 2-12 grossly intact with no focal deficits.  Romberg sign and cerebellar reflexes not assessed.  Psychiatric: Normal judgment and insight. Alert and oriented x 3. Normal mood and appropriate affect.   Data Reviewed: I have personally reviewed following labs and imaging studies  CBC: Recent Labs  Lab 01/10/18 1241 01/11/18 0402  WBC 14.4* 10.4  NEUTROABS 10.4*  --   HGB 16.2 12.6*  HCT 46.8 37.7*  MCV 87.6 86.7  PLT 203 993   Basic Metabolic Panel: Recent Labs  Lab 01/10/18 1242 01/11/18 0402  NA 135 138  K 3.9 3.6  CL 101 109  CO2 21* 22  GLUCOSE 89 108*  BUN 15 10  CREATININE 0.83 0.83  CALCIUM 9.2 7.7*   GFR: Estimated Creatinine Clearance: 130.3 mL/min (by C-G formula based on SCr of 0.83 mg/dL). Liver Function Tests: No results for input(s): AST, ALT, ALKPHOS, BILITOT, PROT, ALBUMIN in the last 168 hours. No results for input(s): LIPASE, AMYLASE in the last 168 hours. No results for input(s): AMMONIA in the last 168  hours. Coagulation Profile: No results for input(s): INR, PROTIME in the last 168 hours. Cardiac Enzymes: No results for input(s): CKTOTAL, CKMB, CKMBINDEX, TROPONINI in the last 168 hours. BNP (last 3 results) No results for input(s): PROBNP in the last 8760 hours. HbA1C: Recent Labs    01/11/18 0402  HGBA1C 5.5   CBG: No results for input(s): GLUCAP in the last 168 hours. Lipid Profile: No results for input(s): CHOL, HDL, LDLCALC, TRIG, CHOLHDL, LDLDIRECT in the last 72 hours. Thyroid Function Tests: No results for input(s): TSH, T4TOTAL, FREET4, T3FREE, THYROIDAB in the last 72 hours. Anemia Panel: No results for input(s): VITAMINB12, FOLATE, FERRITIN, TIBC, IRON, RETICCTPCT in the last 72 hours. Sepsis Labs: Recent Labs  Lab 01/10/18 1307 01/10/18 2231 01/11/18 0015  PROCALCITON  --   --  <0.10  LATICACIDVEN 1.92* 1.35 0.8    No results found for this or any previous visit (from  the past 240 hour(s)).   Radiology Studies: Ct Pelvis W Contrast  Result Date: 01/10/2018 CLINICAL DATA:  Assess perirectal abscess at the right gluteal cleft. EXAM: CT PELVIS WITH CONTRAST TECHNIQUE: Multidetector CT imaging of the pelvis was performed using the standard protocol following the bolus administration of intravenous contrast. CONTRAST:  134m ISOVUE-300 IOPAMIDOL (ISOVUE-300) INJECTION 61% COMPARISON:  None. FINDINGS: Urinary Tract: There is slight wall thickening at the right side of the base of the bladder. The bladder is otherwise unremarkable. Bowel: Visualized small and large bowel loops are grossly unremarkable in appearance. Vascular/Lymphatic: Scattered calcification is seen along the distal abdominal aorta and its branches. The abdominal aorta is otherwise grossly unremarkable. The inferior vena cava is grossly unremarkable. No retroperitoneal lymphadenopathy is seen. No pelvic sidewall lymphadenopathy is identified. Reproductive:  The prostate remains normal in size. Other: There is asymmetric soft tissue inflammation at the right side of the gluteal cleft, with subcutaneous soft tissue edema tracking along the right buttocks. No well defined abscess is seen at this time. Musculoskeletal: No acute osseous abnormalities are identified. Vacuum phenomenon and facet disease is noted at the lower lumbar spine. The visualized musculature is unremarkable in appearance. IMPRESSION: 1. Asymmetric soft tissue inflammation at the right side of the gluteal cleft, with subcutaneous soft tissue edema tracking along the right buttocks. No well-defined abscess seen at this time. 2. Slight wall thickening at the right side of the base of the bladder. This could reflect debris. Cystoscopy could be considered to exclude underlying mass. Aortic Atherosclerosis (ICD10-I70.0). Electronically Signed   By: JGarald BaldingM.D.   On: 01/10/2018 23:13   Dg Chest Port 1 View  Result Date: 01/11/2018 CLINICAL  DATA:  Sepsis EXAM: PORTABLE CHEST 1 VIEW COMPARISON:  None. FINDINGS: Interstitial opacities at the bases. No pleural effusion. No consolidation. Normal heart size. No pneumothorax. IMPRESSION: Increased interstitial opacity at the bases consistent with bronchitic changes, uncertain chronicity. Electronically Signed   By: KDonavan FoilM.D.   On: 01/11/2018 00:24   Scheduled Meds: . heparin  5,000 Units Subcutaneous Q8H  . metroNIDAZOLE  500 mg Oral Q8H   Continuous Infusions: . sodium chloride 125 mL/hr at 01/11/18 0307  . vancomycin 1,000 mg (01/11/18 0606)    LOS: 0 days   OKerney Elbe DO [facility] Pager [phone] If 7PM-7AM, please contact night-coverage [email or web address] Password [facility]4/16/2019, 8:52 AM

## 2018-01-11 NOTE — Care Management Note (Signed)
Case Management Note  Patient Details  Name: Charles Moody MRN: [id number] Date of Birth: 1966/08/01  Subjective/Objective:                    Action/Plan: From home with wife. Bedside nurse will need to begin teaching wife and patient dressing changes  Continue to follow for discharge needs  Expected Discharge Date:                  Expected Discharge Plan:    In-House Referral:     Discharge planning Services     Post Acute Care Choice:    Choice offered to:     DME Arranged:    DME Agency:     HH Arranged:    [place] Agency:     Status of Service:  In process, will continue to follow  If discussed at Long Length of Stay Meetings, dates discussed:    Additional Comments:  Marilu Favre, RN 01/11/2018, 11:37 AM

## 2018-01-11 NOTE — Progress Notes (Signed)
Pharmacy Antibiotic Note  Charles Moody is a 52 y.o. male admitted on 01/10/2018 with LLE abcess/cellulitis.  Pharmacy has been consulted for Vancomycin  Dosing.  Vancomycin 2 g IV given in ED at  midnight  Plan: Vancomycin 1 g IV q8h  Height: 5\' 7"  (170.2 cm) Weight: 253 lb (114.8 kg) IBW/kg (Calculated) : 66.1  Temp (24hrs), Avg:98.9 F (37.2 C), Min:98.2 F (36.8 C), Max:99.6 F (37.6 C)  Recent Labs  Lab 01/10/18 1241 01/10/18 1242 01/10/18 1307 01/10/18 2231  WBC 14.4*  --   --   --   CREATININE  --  0.83  --   --   LATICACIDVEN  --   --  1.92* 1.35    Estimated Creatinine Clearance: 127.5 mL/min (by C-G formula based on SCr of 0.83 mg/dL).    No Known Allergies   Charles Moody 01/11/2018 12:06 AM

## 2018-01-11 NOTE — Consult Note (Signed)
[place] Nurse wound consult note Reason for Consult: Right buttock I&D site 1/4 inch Iodoform gauze strip Kellie Simmering # [id number]) ordered for wound packing.  The following instructions were entered as a wound care order for caring for the area:  Pack 1/4 inch Iodoform gauze strip into the right buttock I&D site.  Leave a portion of the strip hanging out for easy removal.  Cover with dry gauze, tape in place.  Change twice daily.  PROVIDE PAIN MEDICATION ONE HOUR PRIOR TO DRESSING CHANGE. Thank you for the consult.  Discussed plan of care with the patient and bedside nurse.  [place] nurse will not follow at this time.  Please re-consult the [place] team if needed.  Val Riles, RN, MSN, CWOCN, CNS-BC, pager [phone]

## 2018-01-12 DIAGNOSIS — D649 Anemia, unspecified: Secondary | ICD-10-CM

## 2018-01-12 DIAGNOSIS — L0231 Cutaneous abscess of buttock: Secondary | ICD-10-CM

## 2018-01-12 DIAGNOSIS — L03317 Cellulitis of buttock: Secondary | ICD-10-CM

## 2018-01-12 DIAGNOSIS — A419 Sepsis, unspecified organism: Principal | ICD-10-CM

## 2018-01-12 LAB — CBC WITH DIFFERENTIAL/PLATELET
Basophils Absolute: 0 10*3/uL (ref 0.0–0.1)
Basophils Relative: 0 %
Eosinophils Absolute: 0.3 10*3/uL (ref 0.0–0.7)
Eosinophils Relative: 3 %
HCT: 39.2 % (ref 39.0–52.0)
Hemoglobin: 12.7 g/dL — ABNORMAL LOW (ref 13.0–17.0)
Lymphocytes Relative: 37 %
Lymphs Abs: 3 10*3/uL (ref 0.7–4.0)
MCH: 28.7 pg (ref 26.0–34.0)
MCHC: 32.4 g/dL (ref 30.0–36.0)
MCV: 88.7 fL (ref 78.0–100.0)
Monocytes Absolute: 0.7 10*3/uL (ref 0.1–1.0)
Monocytes Relative: 9 %
Neutro Abs: 4 10*3/uL (ref 1.7–7.7)
Neutrophils Relative %: 51 %
Platelets: 198 10*3/uL (ref 150–400)
RBC: 4.42 MIL/uL (ref 4.22–5.81)
RDW: 13.4 % (ref 11.5–15.5)
WBC: 8 10*3/uL (ref 4.0–10.5)

## 2018-01-12 LAB — COMPREHENSIVE METABOLIC PANEL
ALT: 18 U/L (ref 17–63)
AST: 17 U/L (ref 15–41)
Albumin: 2.8 g/dL — ABNORMAL LOW (ref 3.5–5.0)
Alkaline Phosphatase: 74 U/L (ref 38–126)
Anion gap: 6 (ref 5–15)
BUN: 9 mg/dL (ref 6–20)
CO2: 24 mmol/L (ref 22–32)
Calcium: 8.2 mg/dL — ABNORMAL LOW (ref 8.9–10.3)
Chloride: 107 mmol/L (ref 101–111)
Creatinine, Ser: 0.87 mg/dL (ref 0.61–1.24)
GFR calc Af Amer: >60 mL/min (ref >60)
GFR calc non Af Amer: >60 mL/min (ref >60)
Glucose, Bld: 103 mg/dL — ABNORMAL HIGH (ref 65–99)
Potassium: 3.8 mmol/L (ref 3.5–5.1)
Sodium: 137 mmol/L (ref 135–145)
Total Bilirubin: 0.7 mg/dL (ref 0.3–1.2)
Total Protein: 5.7 g/dL — ABNORMAL LOW (ref 6.5–8.1)

## 2018-01-12 LAB — PHOSPHORUS: Phosphorus: 3.4 mg/dL (ref 2.5–4.6)

## 2018-01-12 LAB — MAGNESIUM: Magnesium: 1.8 mg/dL (ref 1.7–2.4)

## 2018-01-12 MED ORDER — PIPERACILLIN-TAZOBACTAM 3.375 G IVPB
3.3750 g | Freq: Three times a day (TID) | INTRAVENOUS | Status: DC
  Administered 2018-01-12 – 2018-01-13 (×3): 3.375 g via INTRAVENOUS
  Filled 2018-01-12 (×4): qty 50

## 2018-01-12 MED ORDER — MORPHINE SULFATE (PF) 4 MG/ML IV SOLN
2.0000 mg | INTRAVENOUS | Status: DC | PRN
  Administered 2018-01-13: 2 mg via INTRAVENOUS
  Filled 2018-01-12: qty 1

## 2018-01-12 MED ORDER — HYDROCODONE-ACETAMINOPHEN 5-325 MG PO TABS
1.0000 | ORAL_TABLET | ORAL | Status: DC | PRN
  Administered 2018-01-12 – 2018-01-13 (×2): 1 via ORAL
  Filled 2018-01-12 (×2): qty 1

## 2018-01-12 NOTE — Progress Notes (Signed)
PROGRESS NOTE    Charles Moody   UMP:[id number]  DOB: 1966/03/25  DOA: 01/10/2018 PCP: Charles Hillock, DO   Brief Narrative:  Charles Kronenberger [name] a 52 y.o.malewithout significantmedical history, who presents with right buttock abscess. He was evaluated by his PCP and sent to the hospital. He states it was already draining at home. He had further I and D in the ED and was admitted.   WBC 14.4, lactic acid 1.92, temperature 99.6, tachycardia, tachypnea,  CT pelvis w/ contrast> Asymmetric soft tissue inflammation at the right side of the gluteal cleft, with subcutaneous soft tissue edema tracking along the right buttocks. No well-defined abscess seen at this time.  Subjective: Pain is improving in the right buttock. No new symptoms.    Assessment & Plan:   Principal Problem:   Abscess and cellulitis of gluteal region - Vanc and Flagyl - add Zosyn- culture pending - appears to be draining well - cont dressings- his fiance will do the dressings at home - only on Morphine- add Hydrocodone and use Morphine for breakthrough pain  Active Problems:   Sepsis - due to above - WBC, Lactic acid normalized now - HIV neg    Normocytic anemia  - out patient management    Obesity, Class III, BMI 40-49.9 (morbid obesity) ([place]) Body mass index is 41.33 kg/m.   DVT prophylaxis:  Heparin Code Status: [place] Family Communication:  Disposition Plan: home hopefully tomorrow Consultants:   none Procedures:   I and D in ED Antimicrobials:  Anti-infectives (From admission, onward)   Start     Dose/Rate Route Frequency Ordered Stop   01/11/18 0600  vancomycin (VANCOCIN) IVPB 1000 mg/200 mL premix     1,000 mg 200 mL/hr over 60 Minutes Intravenous Every 8 hours 01/11/18 0009     01/11/18 0015  vancomycin (VANCOCIN) IVPB 1000 mg/200 mL premix  Status:  Discontinued     1,000 mg 200 mL/hr over 60 Minutes Intravenous  Once 01/11/18 0005 01/11/18 0007   01/11/18 0015   metroNIDAZOLE (FLAGYL) tablet 500 mg     500 mg Oral Every 8 hours 01/11/18 0008     01/10/18 2345  vancomycin (VANCOCIN) 2,000 mg in sodium chloride 0.9 % 500 mL IVPB     2,000 mg 250 mL/hr over 120 Minutes Intravenous  Once 01/10/18 2343 01/11/18 0220       Objective: Vitals:   01/11/18 0600 01/11/18 0659 01/11/18 2113 01/12/18 0500  BP: 134/88 137/80 134/77 (!) 149/85  Pulse: 76 78 75 72  Resp: 14 16 18 18   Temp: 97.9 F (36.6 C) 97.9 F (36.6 C) 98.4 F (36.9 C) 98.5 F (36.9 C)  TempSrc: Oral Oral Oral Oral  SpO2: 97% 98% 97% 98%  Weight:  119.7 kg (263 lb 14.3 oz)    Height:  5\' 7"  (1.702 m)      Intake/Output Summary (Last 24 hours) at 01/12/2018 1327 Last data filed at 01/12/2018 0612 Gross per 24 hour  Intake 4156.25 ml  Output -  Net 4156.25 ml   Filed Weights   01/10/18 1226 01/10/18 2348 01/11/18 0659  Weight: 114.8 kg (253 lb) 114.8 kg (253 lb) 119.7 kg (263 lb 14.3 oz)    Examination: General exam: Appears comfortable  HEENT: PERRLA, oral mucosa moist, no sclera icterus or thrush Respiratory system: Clear to auscultation. Respiratory effort normal. Cardiovascular system: S1 & S2 heard, RRR.   Gastrointestinal system: Abdomen soft, non-tender, nondistended. Normal bowel sound. No organomegaly Central nervous system:  Alert and oriented. No focal neurological deficits. Extremities: No cyanosis, clubbing or edema Skin: right buttock abscess is packed, no surrounding cellulitis Psychiatry:  Mood & affect appropriate.     Data Reviewed: I have personally reviewed following labs and imaging studies  CBC: Recent Labs  Lab 01/10/18 1241 01/11/18 0402 01/12/18 0444  WBC 14.4* 10.4 8.0  NEUTROABS 10.4*  --  4.0  HGB 16.2 12.6* 12.7*  HCT 46.8 37.7* 39.2  MCV 87.6 86.7 88.7  PLT 203 173 297   Basic Metabolic Panel: Recent Labs  Lab 01/10/18 1242 01/11/18 0402 01/12/18 0444  NA 135 138 137  K 3.9 3.6 3.8  CL 101 109 107  CO2 21* 22 24    GLUCOSE 89 108* 103*  BUN 15 10 9   CREATININE 0.83 0.83 0.87  CALCIUM 9.2 7.7* 8.2*  MG  --   --  1.8  PHOS  --   --  3.4   GFR: Estimated Creatinine Clearance: 124.3 mL/min (by C-G formula based on SCr of 0.87 mg/dL). Liver Function Tests: Recent Labs  Lab 01/12/18 0444  AST 17  ALT 18  ALKPHOS 74  BILITOT 0.7  PROT 5.7*  ALBUMIN 2.8*   No results for input(s): LIPASE, AMYLASE in the last 168 hours. No results for input(s): AMMONIA in the last 168 hours. Coagulation Profile: No results for input(s): INR, PROTIME in the last 168 hours. Cardiac Enzymes: No results for input(s): CKTOTAL, CKMB, CKMBINDEX, TROPONINI in the last 168 hours. BNP (last 3 results) No results for input(s): PROBNP in the last 8760 hours. HbA1C: Recent Labs    01/11/18 0402  HGBA1C 5.5   CBG: No results for input(s): GLUCAP in the last 168 hours. Lipid Profile: No results for input(s): CHOL, HDL, LDLCALC, TRIG, CHOLHDL, LDLDIRECT in the last 72 hours. Thyroid Function Tests: No results for input(s): TSH, T4TOTAL, FREET4, T3FREE, THYROIDAB in the last 72 hours. Anemia Panel: No results for input(s): VITAMINB12, FOLATE, FERRITIN, TIBC, IRON, RETICCTPCT in the last 72 hours. Urine analysis: No results found for: COLORURINE, APPEARANCEUR, LABSPEC, PHURINE, GLUCOSEU, HGBUR, BILIRUBINUR, KETONESUR, PROTEINUR, UROBILINOGEN, NITRITE, LEUKOCYTESUR Sepsis Labs: @LABRCNTIP (procalcitonin:4,lacticidven:4) ) Recent Results (from the past 240 hour(s))  Culture, blood (routine x 2)     Status: None (Preliminary result)   Collection Time: 01/10/18 10:22 PM  Result Value Ref Range Status   Specimen Description BLOOD LEFT HAND  Final   Special Requests   Final    BOTTLES DRAWN AEROBIC AND ANAEROBIC Blood Culture adequate volume   Culture   Final    NO GROWTH < 24 HOURS Performed at [facility], [address] [place], [place] [address]    Report Status PENDING  Incomplete         Radiology  Studies: Ct Pelvis W Contrast  Result Date: 01/10/2018 CLINICAL DATA:  Assess perirectal abscess at the right gluteal cleft. EXAM: CT PELVIS WITH CONTRAST TECHNIQUE: Multidetector CT imaging of the pelvis was performed using the standard protocol following the bolus administration of intravenous contrast. CONTRAST:  133mL ISOVUE-300 IOPAMIDOL (ISOVUE-300) INJECTION 61% COMPARISON:  None. FINDINGS: Urinary Tract: There is slight wall thickening at the right side of the base of the bladder. The bladder is otherwise unremarkable. Bowel: Visualized small and large bowel loops are grossly unremarkable in appearance. Vascular/Lymphatic: Scattered calcification is seen along the distal abdominal aorta and its branches. The abdominal aorta is otherwise grossly unremarkable. The inferior vena cava is grossly unremarkable. No retroperitoneal lymphadenopathy is seen. No pelvic  sidewall lymphadenopathy is identified. Reproductive:  The prostate remains normal in size. Other: There is asymmetric soft tissue inflammation at the right side of the gluteal cleft, with subcutaneous soft tissue edema tracking along the right buttocks. No well defined abscess is seen at this time. Musculoskeletal: No acute osseous abnormalities are identified. Vacuum phenomenon and facet disease is noted at the lower lumbar spine. The visualized musculature is unremarkable in appearance. IMPRESSION: 1. Asymmetric soft tissue inflammation at the right side of the gluteal cleft, with subcutaneous soft tissue edema tracking along the right buttocks. No well-defined abscess seen at this time. 2. Slight wall thickening at the right side of the base of the bladder. This could reflect debris. Cystoscopy could be considered to exclude underlying mass. Aortic Atherosclerosis (ICD10-I70.0). Electronically Signed   By: Garald Balding M.D.   On: 01/10/2018 23:13   Dg Chest Port 1 View  Result Date: 01/11/2018 CLINICAL DATA:  Sepsis EXAM: PORTABLE CHEST 1  VIEW COMPARISON:  None. FINDINGS: Interstitial opacities at the bases. No pleural effusion. No consolidation. Normal heart size. No pneumothorax. IMPRESSION: Increased interstitial opacity at the bases consistent with bronchitic changes, uncertain chronicity. Electronically Signed   By: Donavan Foil M.D.   On: 01/11/2018 00:24      Scheduled Meds: . heparin  5,000 Units Subcutaneous Q8H  . metroNIDAZOLE  500 mg Oral Q8H   Continuous Infusions: . vancomycin Stopped (01/12/18 0612)     LOS: 1 day    Time spent in minutes: 35    Debbe Odea, MD [facility] Pager: [email or web address] Password [email or web address] 01/12/2018, 1:27 PM

## 2018-01-12 NOTE — Progress Notes (Signed)
Pharmacy Antibiotic Note  Charles Moody is a 52 y.o. male admitted on 01/10/2018 with right buttock pain and started on vancomycin and Flagyl for cellulitis/abscess.  Now to add Zosyn.    Renal function is stable.  Afebrile, WBC normalized, LA down 0.8, PCT < 0.1.  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 Monitor renal fxn, clinical progress No plan for vanc trough unless not discharging tomorrow   Height: 5\' 7"  (170.2 cm) Weight: 263 lb 14.3 oz (119.7 kg) IBW/kg (Calculated) : 66.1  Temp (24hrs), Avg:98.5 F (36.9 C), Min:98.4 F (36.9 C), Max:98.5 F (36.9 C)  Recent Labs  Lab 01/10/18 1241 01/10/18 1242 01/10/18 1307 01/10/18 2231 01/11/18 0015 01/11/18 0402 01/12/18 0444  WBC 14.4*  --   --   --   --  10.4 8.0  CREATININE  --  0.83  --   --   --  0.83 0.87  LATICACIDVEN  --   --  1.92* 1.35 0.8  --   --     Estimated Creatinine Clearance: 124.3 mL/min (by C-G formula based on SCr of 0.87 mg/dL).    No Known Allergies   Vanc 4/16 >> Flagyl 4/16 >> Zosyn 4/17 >>  4/15 BCx - 4/15 wound cx (deep) -    Robet Crutchfield D. Mina Marble, PharmD, BCPS, BCCCP Pager:  [phone] 01/12/2018, 1:51 PM

## 2018-01-13 MED ORDER — AMOXICILLIN-POT CLAVULANATE 875-125 MG PO TABS: 1.0000 | ORAL_TABLET | Freq: Two times a day (BID) | ORAL | 0 refills | Status: DC

## 2018-01-13 MED ORDER — SENNOSIDES-DOCUSATE SODIUM 8.6-50 MG PO TABS: 1.0000 | ORAL_TABLET | Freq: Every evening | ORAL | 0 refills | Status: AC | PRN

## 2018-01-13 MED ORDER — DOXYCYCLINE MONOHYDRATE 100 MG PO TABS: 100.0000 mg | ORAL_TABLET | Freq: Two times a day (BID) | ORAL | 0 refills | Status: DC

## 2018-01-13 NOTE — Progress Notes (Deleted)
Discharge home. Home discharge instruction given, no question verbalized. 

## 2018-01-13 NOTE — Discharge Summary (Signed)
Physician Discharge Summary  Charles Moody BWL:[id number] DOB: Nov 20, 1965 DOA: 01/10/2018  PCP: Ma Hillock, DO  Admit date: 01/10/2018 Discharge date: 01/13/2018  Admitted From: home Disposition:  home   Recommendations for Outpatient Follow-up:  1. 7 more days of Augmentin and Doxy recommended  2. His Fiance will be doing dressing changes  Discharge Condition:  stable   CODE STATUS:  Full code   Consultations:  none    Discharge Diagnoses:  Principal Problem:   Abscess and cellulitis of gluteal region Active Problems:   Sepsis ([place])   Normocytic anemia   Hyperglycemia   Obesity, Class III, BMI 40-49.9 (morbid obesity) ([facility])    Subjective: Mild pain at site of abscess.   Brief Summary: Charles Allinson [name] a 52 y.o.malewithout significantmedical history, who presents with right buttock abscess. He was evaluated by his PCP and sent to the hospital. He states it was already draining at home. He had further I and D in the ED and was admitted.   WBC 14.4, lactic acid 1.92, temperature 99.6, tachycardia, tachypnea,  CT pelvis w/ contrast> Asymmetric soft tissue inflammation at the right side of the gluteal cleft, with subcutaneous soft tissue edema tracking along the right buttocks. No well-defined abscess seen at this time.    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     Discharge Exam: Vitals:   01/12/18 2015 01/13/18 0511  BP: (!) 152/97 123/69  Pulse: 85 (!) 54  Resp: 18 20  Temp: 98.2 F (36.8 C) 97.8 F (36.6 C)  SpO2: 95% 97%   Vitals:   01/12/18 0500 01/12/18 1357 01/12/18 2015 01/13/18 0511  BP: (!) 149/85 132/86 (!) 152/97 123/69  Pulse: 72 70 85 (!) 54  Resp: 18  18 20   Temp: 98.5 F (36.9 C) 98.6 F (37 C) 98.2 F (36.8 C) 97.8 F (36.6 C)  TempSrc: Oral Oral Oral Oral  SpO2: 98% 98% 95% 97%  Weight:      Height:        General: Pt is alert, awake, not in acute distress Cardiovascular: RRR, S1/S2 +, no rubs, no gallops Respiratory: CTA bilaterally, no wheezing, no rhonchi Abdominal: Soft, NT, ND, bowel sounds + Extremities: no edema, no cyanosis Skin: wound examined today, wick in place, has pale yellow drainage on dressing surrounding area is indurated and dark pink   Discharge Instructions  Discharge Instructions    Diet - low sodium heart healthy   Complete by:  As directed    Discharge instructions   Complete by:  As directed    You can take 7 more  days of the Augmentin which was previously prescribed to you. Wound care: Pack 1/4 inch Iodoform gauze strip into the right buttock I&D site.  Leave a portion of the strip hanging out for easy removal.  Cover with dry gauze, tape in place.  Change twice daily.   Increase activity slowly   Complete by:  As directed      Allergies as of 01/13/2018   No Known Allergies     Medication List    TAKE these medications   amoxicillin-clavulanate 875-125 MG tablet Commonly known as:  AUGMENTIN Take 1 tablet by mouth 2 (two) times daily.   doxycycline 100 MG tablet Commonly known as:  ADOXA Take 1 tablet (100 mg total) by mouth 2 (two) times daily.   HYDROcodone-acetaminophen 10-325 MG tablet Commonly known as:   NORCO Take 1 tablet by mouth every 8 (eight) hours as needed.   senna-docusate 8.6-50 MG tablet Commonly known as:  Senokot-S Take 1 tablet by mouth at bedtime as needed for mild constipation.       No Known Allergies   Procedures/Studies: I and D in ED  Ct Pelvis W Contrast  Result Date: 01/10/2018 CLINICAL DATA:  Assess perirectal abscess at the right gluteal cleft. EXAM: CT PELVIS WITH CONTRAST TECHNIQUE: Multidetector CT imaging of the pelvis was performed using the standard protocol following the bolus administration of intravenous contrast. CONTRAST:  149mL ISOVUE-300 IOPAMIDOL (ISOVUE-300) INJECTION 61% COMPARISON:  None. FINDINGS: Urinary Tract: There is slight wall thickening at the right side of the base of the bladder. The bladder is otherwise unremarkable. Bowel: Visualized small and large bowel loops are grossly unremarkable in appearance. Vascular/Lymphatic: Scattered calcification is seen along the distal abdominal aorta and its branches. The abdominal aorta is otherwise grossly unremarkable. The inferior vena cava is grossly unremarkable. No retroperitoneal lymphadenopathy is seen. No pelvic sidewall lymphadenopathy is identified. Reproductive:  The prostate remains normal in size. Other: There is asymmetric soft tissue inflammation at the right side of the gluteal cleft, with subcutaneous soft tissue edema tracking along the right buttocks. No well defined abscess is seen at this time. Musculoskeletal: No acute osseous abnormalities are identified. Vacuum phenomenon and facet disease is noted at the lower lumbar spine. The visualized musculature is unremarkable in appearance. IMPRESSION: 1. Asymmetric soft tissue inflammation at the right side of the gluteal cleft, with subcutaneous soft tissue edema tracking along the right buttocks. No well-defined abscess seen at this time. 2. Slight wall thickening at the right side of the base of the bladder. This could reflect debris.  Cystoscopy could be considered to exclude underlying mass. Aortic Atherosclerosis (ICD10-I70.0). Electronically Signed   By: Garald Balding M.D.   On: 01/10/2018 23:13   Dg Chest Port 1 View  Result Date: 01/11/2018 CLINICAL DATA:  Sepsis EXAM: PORTABLE CHEST 1 VIEW COMPARISON:  None. FINDINGS: Interstitial opacities at the bases. No pleural effusion. No consolidation. Normal heart size. No pneumothorax. IMPRESSION: Increased interstitial opacity at the bases consistent with bronchitic changes, uncertain chronicity. Electronically Signed   By: Donavan Foil M.D.   On: 01/11/2018 00:24     The results of significant diagnostics from this hospitalization (including imaging, microbiology, ancillary and laboratory) are listed below for reference.     Microbiology: Recent Results (from the past 240 hour(s))  Culture, blood (routine x 2)     Status: None (Preliminary result)   Collection Time: 01/10/18 10:22 PM  Result Value Ref Range Status   Specimen Description BLOOD LEFT HAND  Final   Special Requests   Final    BOTTLES DRAWN AEROBIC AND ANAEROBIC Blood Culture adequate volume   Culture   Final    NO GROWTH 2 DAYS Performed at [facility], [address], [place], [place] [address]    Report Status PENDING  Incomplete     Labs: BNP (last 3 results) No results for input(s): BNP in the last 8760 hours. Basic Metabolic Panel: Recent Labs  Lab 01/10/18 1242 01/11/18 0402 01/12/18 0444  NA 135 138 137  K 3.9 3.6 3.8  CL 101 109 107  CO2 21* 22 24  GLUCOSE 89 108* 103*  BUN 15 10 9   CREATININE 0.83 0.83 0.87  CALCIUM 9.2 7.7* 8.2*  MG  --   --  1.8  PHOS  --   --  3.4   Liver Function Tests: Recent Labs  Lab 01/12/18 0444  AST 17  ALT 18  ALKPHOS 74  BILITOT 0.7  PROT 5.7*  ALBUMIN 2.8*   No results for input(s): LIPASE, AMYLASE in the last 168 hours. No results for input(s): AMMONIA in the last 168 hours. CBC: Recent Labs  Lab 01/10/18 1241 01/11/18 0402  01/12/18 0444  WBC 14.4* 10.4 8.0  NEUTROABS 10.4*  --  4.0  HGB 16.2 12.6* 12.7*  HCT 46.8 37.7* 39.2  MCV 87.6 86.7 88.7  PLT 203 173 198   Cardiac Enzymes: No results for input(s): CKTOTAL, CKMB, CKMBINDEX, TROPONINI in the last 168 hours. BNP: Invalid input(s): POCBNP CBG: No results for input(s): GLUCAP in the last 168 hours. D-Dimer No results for input(s): DDIMER in the last 72 hours. Hgb A1c Recent Labs    01/11/18 0402  HGBA1C 5.5   Lipid Profile No results for input(s): CHOL, HDL, LDLCALC, TRIG, CHOLHDL, LDLDIRECT in the last 72 hours. Thyroid function studies No results for input(s): TSH, T4TOTAL, T3FREE, THYROIDAB in the last 72 hours.  Invalid input(s): FREET3 Anemia work up No results for input(s): VITAMINB12, FOLATE, FERRITIN, TIBC, IRON, RETICCTPCT in the last 72 hours. Urinalysis No results found for: COLORURINE, APPEARANCEUR, [place], [place], [place], [place], [place], [place], PROTEINUR, UROBILINOGEN, NITRITE, LEUKOCYTESUR Sepsis Labs Invalid input(s): PROCALCITONIN,  WBC,  LACTICIDVEN Microbiology Recent Results (from the past 240 hour(s))  Culture, blood (routine x 2)     Status: None (Preliminary result)   Collection Time: 01/10/18 10:22 PM  Result Value Ref Range Status   Specimen Description BLOOD LEFT HAND  Final   Special Requests   Final    BOTTLES DRAWN AEROBIC AND ANAEROBIC Blood Culture adequate volume   Culture   Final    NO GROWTH 2 DAYS Performed at [facility], [address], [place], [place] [address]    Report Status PENDING  Incomplete     Time coordinating discharge: 42  SIGNED:   Debbe Odea, MD  [facility] 01/13/2018, 11:20 AM Pager   If 7PM-7AM, please contact night-coverage [email or web address] Password [email or web address]

## 2018-01-13 NOTE — Progress Notes (Signed)
Pt discharged home in stable condition after going over discharge instructions with no concerns voiced. AVS given before leaving unit

## 2018-01-15 LAB — CULTURE, BLOOD (ROUTINE X 2)
Culture: NO GROWTH
Special Requests: ADEQUATE

## 2018-01-18 ENCOUNTER — Encounter: Payer: Self-pay | Admitting: Family Medicine

## 2018-01-18 ENCOUNTER — Ambulatory Visit: Payer: BLUE CROSS/BLUE SHIELD | Admitting: Family Medicine

## 2018-01-18 VITALS — BP 142/94 | HR 83 | Temp 97.9°F | Resp 20 | Ht 67.0 in | Wt 253.0 lb

## 2018-01-18 DIAGNOSIS — R030 Elevated blood-pressure reading, without diagnosis of hypertension: Secondary | ICD-10-CM | POA: Diagnosis not present

## 2018-01-18 DIAGNOSIS — L03317 Cellulitis of buttock: Secondary | ICD-10-CM | POA: Diagnosis not present

## 2018-01-18 DIAGNOSIS — D649 Anemia, unspecified: Secondary | ICD-10-CM

## 2018-01-18 DIAGNOSIS — L0231 Cutaneous abscess of buttock: Secondary | ICD-10-CM

## 2018-01-18 DIAGNOSIS — A419 Sepsis, unspecified organism: Secondary | ICD-10-CM

## 2018-01-18 NOTE — Progress Notes (Signed)
Charles Moody , [birth year], 52 y.o., male MRN: [id number] Patient Care Team    Relationship Specialty Notifications Start End  Ma Hillock, DO PCP - General Family Medicine  01/10/18     Chief Complaint  Patient presents with  . Hospitalization Follow-up    abscess     Subjective:  Charles Moody  is a 52 y.o. male presents for hospital follow up after recent admission on January 10, 2018 for primary diagnosis abscess and cellulitis of gluteal region with sepsis. Patient was discharged on January 13, 2018 to home. Patients discharge summary has been reviewed, as well as all labs/image studies obtained during hospitalization.   Patients hospital course: It was seen in this office and diagnosed with a gluteal abscess, was attempted to get into a surgeon's office without success.  Patient was encouraged to go to the emergency room in which he underwent a CT which was positive for symmetric soft tissue inflammation of the right gluteal cleft with subcutaneous soft tissue edema tracking along the right buttocks.  No well-defined abscess seen.  Slight wall thickening on the right side of the base of the bladder.  Patient has an elevated CRP of 5.9, evaded white count 14.4, normal prolactin, elevated lactic acid.  Blood cultures were obtained and negative.  Wound cultures were obtained but not sent. Since hospital discharge patient reports he is doing much better.  He is continuing to take the Augmentin and doxycycline as prescribed.  His fiance is helping with dressing changes daily.  He reports he is not 100% she understands what she is doing, she is not presently here today for education.  Replacing the iodoform packing daily.  He denies any redness, drainage or pain to the area.  He feels okay to go back to work.   Recent Labs  Lab 01/12/18 0444  HGB 12.7*  HCT 39.2  WBC 8.0  PLT 198   CMP Latest Ref Rng & Units 01/12/2018 01/11/2018 01/10/2018  Glucose 65 - 99 mg/dL 103(H) 108(H) 89    BUN 6 - 20 mg/dL 9 10 15   Creatinine 0.61 - 1.24 mg/dL 0.87 0.83 0.83  Sodium 135 - 145 mmol/L 137 138 135  Potassium 3.5 - 5.1 mmol/L 3.8 3.6 3.9  Chloride 101 - 111 mmol/L 107 109 101  CO2 22 - 32 mmol/L 24 22 21(L)  Calcium 8.9 - 10.3 mg/dL 8.2(L) 7.7(L) 9.2  Total Protein 6.5 - 8.1 g/dL 5.7(L) - -  Total Bilirubin 0.3 - 1.2 mg/dL 0.7 - -  Alkaline Phos 38 - 126 U/L 74 - -  AST 15 - 41 U/L 17 - -  ALT 17 - 63 U/L 18 - -    Ct Pelvis W Contrast  Result Date: 01/10/2018 CLINICAL DATA:  Assess perirectal abscess at the right gluteal cleft. EXAM: CT PELVIS WITH CONTRAST TECHNIQUE: Multidetector CT imaging of the pelvis was performed using the standard protocol following the bolus administration of intravenous contrast. CONTRAST:  174mL ISOVUE-300 IOPAMIDOL (ISOVUE-300) INJECTION 61% COMPARISON:  None. FINDINGS: Urinary Tract: There is slight wall thickening at the right side of the base of the bladder. The bladder is otherwise unremarkable. Bowel: Visualized small and large bowel loops are grossly unremarkable in appearance. Vascular/Lymphatic: Scattered calcification is seen along the distal abdominal aorta and its branches. The abdominal aorta is otherwise grossly unremarkable. The inferior vena cava is grossly unremarkable. No retroperitoneal lymphadenopathy is seen. No pelvic sidewall lymphadenopathy is identified. Reproductive:  The prostate remains normal  in size. Other: There is asymmetric soft tissue inflammation at the right side of the gluteal cleft, with subcutaneous soft tissue edema tracking along the right buttocks. No well defined abscess is seen at this time. Musculoskeletal: No acute osseous abnormalities are identified. Vacuum phenomenon and facet disease is noted at the lower lumbar spine. The visualized musculature is unremarkable in appearance. IMPRESSION: 1. Asymmetric soft tissue inflammation at the right side of the gluteal cleft, with subcutaneous soft tissue edema tracking  along the right buttocks. No well-defined abscess seen at this time. 2. Slight wall thickening at the right side of the base of the bladder. This could reflect debris. Cystoscopy could be considered to exclude underlying mass. Aortic Atherosclerosis (ICD10-I70.0). Electronically Signed   By: Garald Balding M.D.   On: 01/10/2018 23:13   Dg Chest Port 1 View  Result Date: 01/11/2018 CLINICAL DATA:  Sepsis EXAM: PORTABLE CHEST 1 VIEW COMPARISON:  None. FINDINGS: Interstitial opacities at the bases. No pleural effusion. No consolidation. Normal heart size. No pneumothorax. IMPRESSION: Increased interstitial opacity at the bases consistent with bronchitic changes, uncertain chronicity. Electronically Signed   By: Donavan Foil M.D.   On: 01/11/2018 00:24    Depression screen PHQ 2/9 01/10/2018  Decreased Interest 0  Down, Depressed, Hopeless 0  PHQ - 2 Score 0    No Known Allergies Social History   Tobacco Use  . Smoking status: Former Smoker    Packs/day: 1.50    Years: 36.00    Pack years: 54.00    Types: Cigarettes    Last attempt to quit: 2017    Years since quitting: 2.3  . Smokeless tobacco: Never Used  Substance Use Topics  . Alcohol use: Yes    Comment: 01/11/2018 "3 beers/month; if that"   Past Medical History:  Diagnosis Date  . Cellulitis and abscess of buttock    gluteal region  . Chicken pox   . Chronic bronchitis ([place])    "kicked off by allergies to pollen"   Past Surgical History:  Procedure Laterality Date  . APPENDECTOMY  1972  . UMBILICAL HERNIA REPAIR  2016   Family History  Problem Relation Age of Onset  . Arthritis Mother   . Diabetes Mother   . Arthritis Father    Allergies as of 01/18/2018   No Known Allergies     Medication List        Accurate as of 01/18/18  2:42 PM. Always use your most recent med list.          amoxicillin-clavulanate 875-125 MG tablet Commonly known as:  AUGMENTIN Take 1 tablet by mouth 2 (two) times daily.     doxycycline 100 MG tablet Commonly known as:  ADOXA Take 1 tablet (100 mg total) by mouth 2 (two) times daily.   HYDROcodone-acetaminophen 10-325 MG tablet Commonly known as:  NORCO Take 1 tablet by mouth every 8 (eight) hours as needed.   senna-docusate 8.6-50 MG tablet Commonly known as:  Senokot-S Take 1 tablet by mouth at bedtime as needed for mild constipation.       All past medical history, surgical history, allergies, family history, immunizations and medications were updated in the EMR today and reviewed under the history and medication portions of their EMR.      ROS: Negative, with the exception of above mentioned in HPI   Objective:  BP (!) 142/94 (BP Location: Right Arm, Patient Position: Sitting, Cuff Size: Large)   Pulse 83   Temp 97.9 F (  36.6 C)   Resp 20   Ht 5\' 7"  (1.702 m)   Wt 253 lb (114.8 kg)   SpO2 97%   BMI 39.63 kg/m  Body mass index is 39.63 kg/m. Gen: Afebrile. No acute distress. Nontoxic in appearance, well developed, well nourished.  HENT: AT. .  MMM, no oral lesions. \Eyes:Pupils Equal Round Reactive to light, Extraocular movements intact,  Conjunctiva without redness, discharge or icterus. CV: RRR no murmur, no edema Chest: CTAB, no wheeze or crackles. Good air movement, normal resp effort.  Skin: No erythema, no soft tissue swelling, healing right gluteal abscess.  Small amount of serosanguineous drainage with iodoform packing in place.  No rashes, purpura or petechiae.  Neuro:  Normal gait. PERLA. EOMi. Alert. Oriented x3   Assessment/Plan: Charles Moody is a 52 y.o. male present for OV for Hospital discharge follow up Abscess and cellulitis of gluteal region//sepsis Seems to be improving and healing well.  Has questions surrounding dressing changes.  They are repacking the iodoform daily.  Given location referred to wound care center for assistance.  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 -To new antibiotics for full course. - AMB referral to wound care center  Normocytic anemia/Hypocalcemia Uncertain etiology and may be secondary to sepsis.  Patient was encouraged to follow-up in 2-3 months with a full physical in which these labs will be retested to ensure return to normal.  Elevated blood pressure reading Elevated blood pressure in office today.  He is a little anxious surrounding being able to return to work.  Reviewed blood pressures from inpatient admission which were normal to borderline.  We will hold off on starting medication, however patient was strongly encouraged to follow-up with CPE in 3 months if blood pressure elevated at that time will discuss starting low-dose BP regimen.   Reviewed expectations re: course of current medical issues.  Discussed self-management of symptoms.  Outlined signs and symptoms indicating need for more acute intervention.  Patient verbalized understanding and all questions were answered.  Patient received an After-Visit Summary.  Any changes in medications were reviewed and patient was provided with updated med list with their AVS.      No orders of the defined types were placed in this encounter.    Note is dictated utilizing voice recognition software. Although note has been proof read prior to signing, occasional typographical errors still can be missed. If any questions arise, please do not hesitate to call for verification.   electronically signed by:  Howard Pouch, DO  [place]

## 2018-01-18 NOTE — Patient Instructions (Signed)
Your wound looks good overall.  I have sent you to wound care to make sure it continues to heal properly given location.  Continue daily dressing changes with the packing they gave you. Try to wet with STERILE saline prior changing.   Follow up for physical in 3 months.   Please help Charles Moody help you:  We are honored you have chosen [facility] for your Primary Care home. Below you will find basic instructions that you may need to access in the future. Please help Charles Moody help you by reading the instructions, which cover many of the frequent questions we experience.   Prescription refills and request:  -In order to allow more efficient response time, please call your pharmacy for all refills. They will forward the request electronically to Charles Moody. This allows for the quickest possible response. Request left on a nurse line can take longer to refill, since these are checked as time allows between office patients and other phone calls.  - refill request can take up to 3-5 working days to complete.  - If request is sent electronically and request is appropiate, it is usually completed in 1-2 business days.  - all patients will need to be seen routinely for all chronic medical conditions requiring prescription medications (see follow-up below). If you are overdue for follow up on your condition, you will be asked to make an appointment and we will call in enough medication to cover you until your appointment (up to 30 days).  - all controlled substances will require a face to face visit to request/refill.  - if you desire your prescriptions to go through a new pharmacy, and have an active script at original pharmacy, you will need to call your pharmacy and have scripts transferred to new pharmacy. This is completed between the pharmacy locations and not by your provider.    Results: If any images or labs were ordered, it can take up to 1 week to get results depending on the test ordered and the lab/facility  running and resulting the test. - Normal or stable results, which do not need further discussion, may be released to your mychart immediately with attached note to you. A call may not be generated for normal results. Please make certain to sign up for mychart. If you have questions on how to activate your mychart you can call the front office.  - If your results need further discussion, our office will attempt to contact you via phone, and if unable to reach you after 2 attempts, we will release your abnormal result to your mychart with instructions.  - All results will be automatically released in mychart after 1 week.  - Your provider will provide you with explanation and instruction on all relevant material in your results. Please keep in mind, results and labs may appear confusing or abnormal to the untrained eye, but it does not mean they are actually abnormal for you personally. If you have any questions about your results that are not covered, or you desire more detailed explanation than what was provided, you should make an appointment with your provider to do so.   Our office handles many outgoing and incoming calls daily. If we have not contacted you within 1 week about your results, please check your mychart to see if there is a message first and if not, then contact our office.  In helping with this matter, you help decrease call volume, and therefore allow Charles Moody to be able to respond to  patients needs more efficiently.   Acute office visits (sick visit):  An acute visit is intended for a new problem and are scheduled in shorter time slots to allow schedule openings for patients with new problems. This is the appropriate visit to discuss a new problem. In order to provide you with excellent quality medical care with proper time for you to explain your problem, have an exam and receive treatment with instructions, these appointments should be limited to one new problem per visit. If you experience a  new problem, in which you desire to be addressed, please make an acute office visit, we save openings on the schedule to accommodate you. Please do not save your new problem for any other type of visit, let Charles Moody take care of it properly and quickly for you.   Follow up visits:  Depending on your condition(s) your provider will need to see you routinely in order to provide you with quality care and prescribe medication(s). Most chronic conditions (Example: hypertension, Diabetes, depression/anxiety... etc), require visits a couple times a year. Your provider will instruct you on proper follow up for your personal medical conditions and history. Please make certain to make follow up appointments for your condition as instructed. Failing to do so could result in lapse in your medication treatment/refills. If you request a refill, and are overdue to be seen on a condition, we will always provide you with a 30 day script (once) to allow you time to schedule.    Medicare wellness (well visit): - we have a wonderful Nurse Maudie Mercury), that will meet with you and provide you will yearly medicare wellness visits. These visits should occur yearly (can not be scheduled less than 1 calendar year apart) and cover preventive health, immunizations, advance directives and screenings you are entitled to yearly through your medicare benefits. Do not miss out on your entitled benefits, this is when medicare will pay for these benefits to be ordered for you.  These are strongly encouraged by your provider and is the appropriate type of visit to make certain you are up to date with all preventive health benefits. If you have not had your medicare wellness exam in the last 12 months, please make certain to schedule one by calling the office and schedule your medicare wellness with Maudie Mercury as soon as possible.   Yearly physical (well visit):  - Adults are recommended to be seen yearly for physicals. Check with your insurance and date of  your last physical, most insurances require one calendar year between physicals. Physicals include all preventive health topics, screenings, medical exam and labs that are appropriate for gender/age and history. You may have fasting labs needed at this visit. This is a well visit (not a sick visit), new problems should not be covered during this visit (see acute visit).  - Pediatric patients are seen more frequently when they are younger. Your provider will advise you on well child visit timing that is appropriate for your their age. - This is not a medicare wellness visit. Medicare wellness exams do not have an exam portion to the visit. Some medicare companies allow for a physical, some do not allow a yearly physical. If your medicare allows a yearly physical you can schedule the medicare wellness with our nurse Maudie Mercury and have your physical with your provider after, on the same day. Please check with insurance for your full benefits.   Late Policy/No Shows:  - all new patients should arrive 15-30 minutes earlier than appointment  to allow Charles Moody time  to  obtain all personal demographics,  insurance information and for you to complete office paperwork. - All established patients should arrive 10-15 minutes earlier than appointment time to update all information and be checked in .  - In our best efforts to run on time, if you are late for your appointment you will be asked to either reschedule or if able, we will work you back into the schedule. There will be a wait time to work you back in the schedule,  depending on availability.  - If you are unable to make it to your appointment as scheduled, please call 24 hours ahead of time to allow Charles Moody to fill the time slot with someone else who needs to be seen. If you do not cancel your appointment ahead of time, you may be charged a no show fee.

## 2018-01-26 ENCOUNTER — Encounter (HOSPITAL_BASED_OUTPATIENT_CLINIC_OR_DEPARTMENT_OTHER): Payer: BLUE CROSS/BLUE SHIELD

## 2018-10-13 IMAGING — CT CT PELVIS W/ CM
2 of 3 series · 16 of 46 positions shown, 18 images · IV contrast (Omni 300)
Comparison: None.

CLINICAL DATA: Assess perirectal abscess at the right gluteal
cleft.

EXAM:
CT PELVIS WITH CONTRAST
TECHNIQUE: Multidetector CT imaging of the pelvis was performed using the
standard protocol following the bolus administration of intravenous
contrast.
CONTRAST:  100mL 1FR0NF-EZZ IOPAMIDOL (1FR0NF-EZZ) INJECTION 61%

[Series 3: pelvis with 5.0 · axial · 0.96mm/px · z∈[+804,+1134]mm · 13 of 76 slices shown, 15 images]
[im 5/76  soft-tissue]
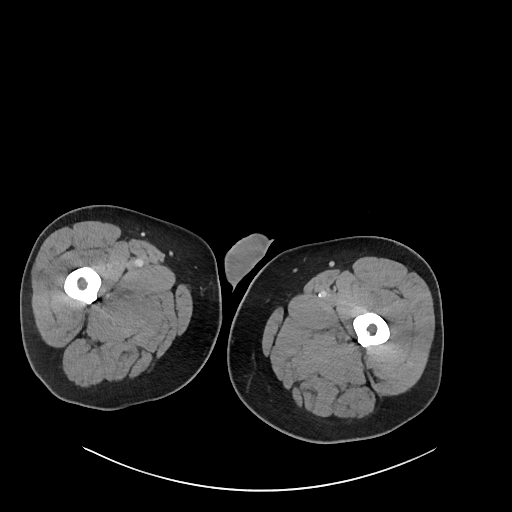
[im 5/76  bone]
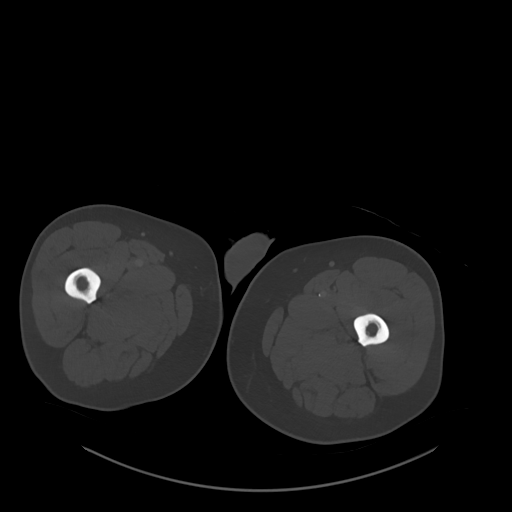
[im 10/76  soft-tissue]
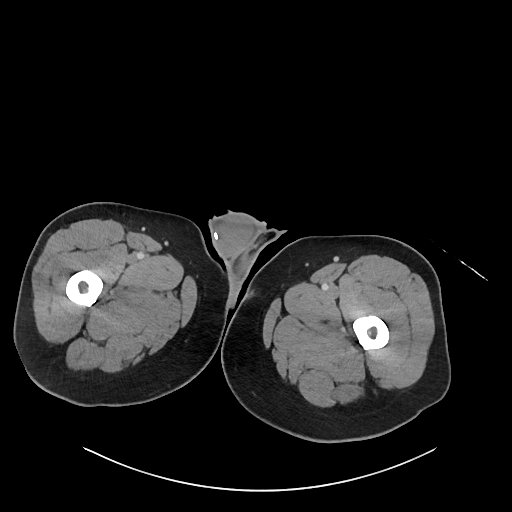
[im 15/76  soft-tissue]
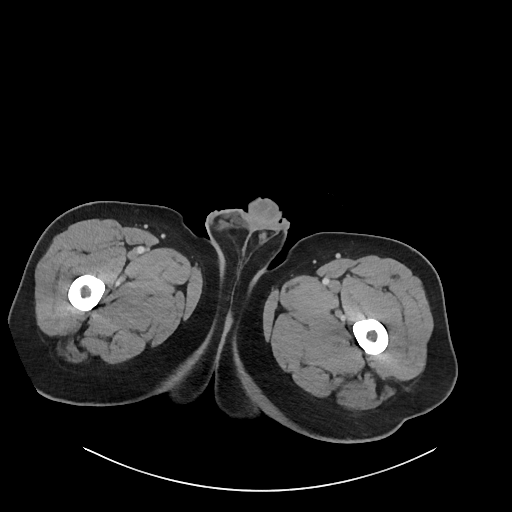
[im 22/76  soft-tissue]
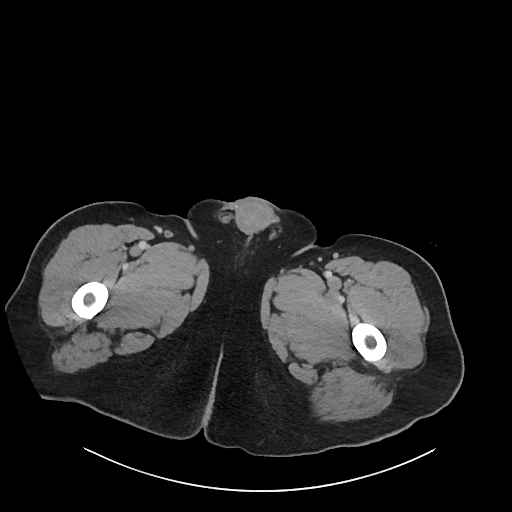
[im 27/76  soft-tissue]
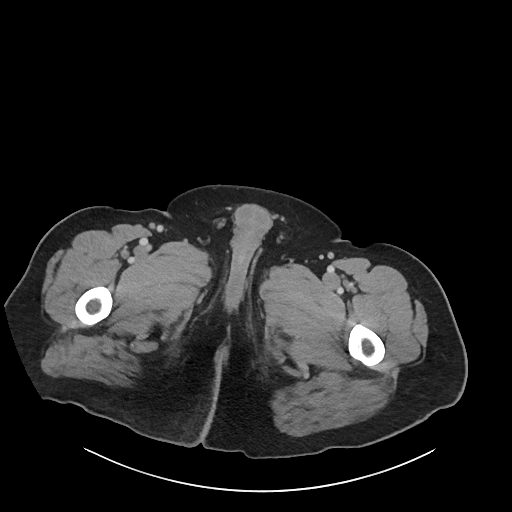
[im 32/76  soft-tissue]
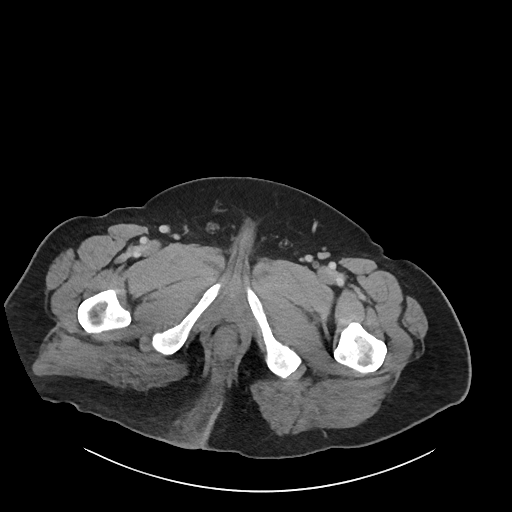
[im 39/76  soft-tissue]
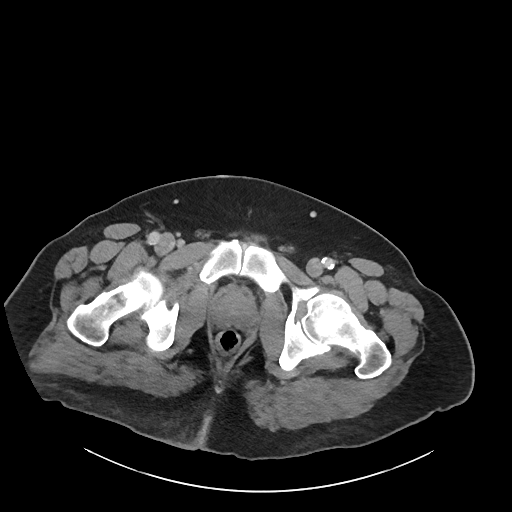
[im 44/76  soft-tissue]
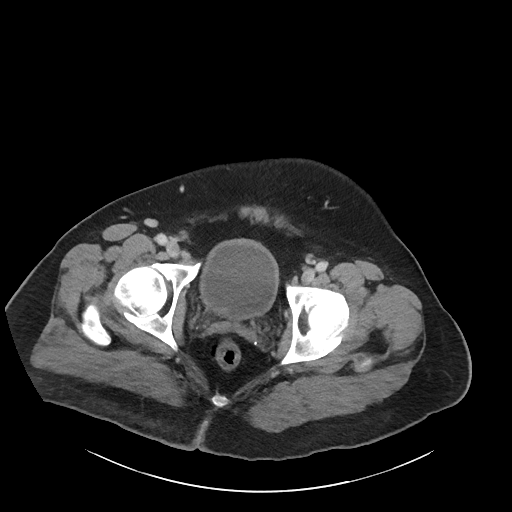
[im 49/76  soft-tissue]
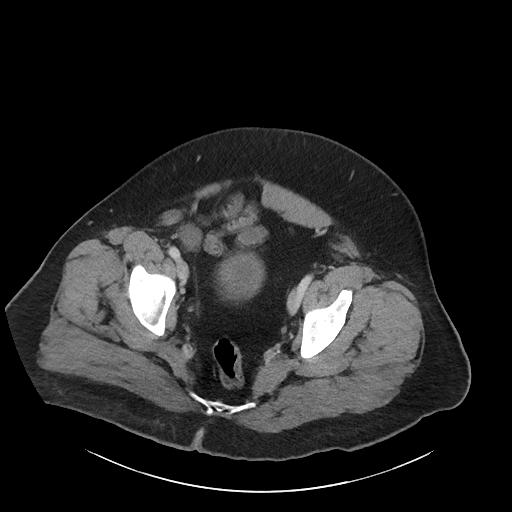
[im 49/76  bone]
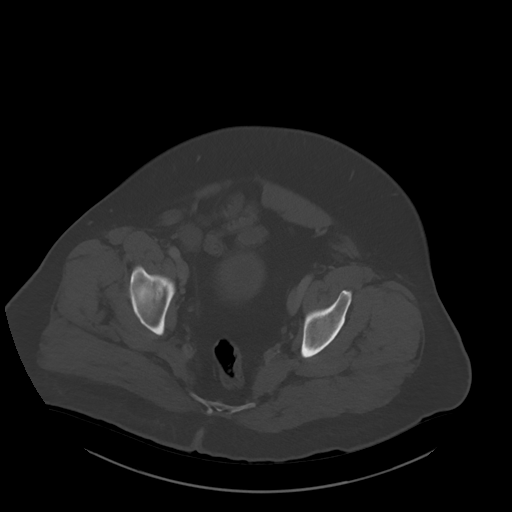
[im 54/76  soft-tissue]
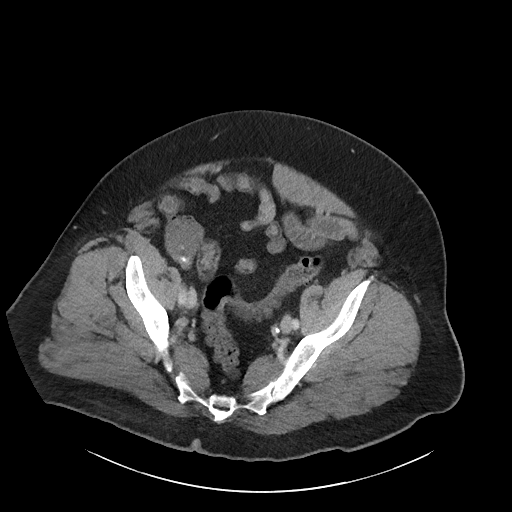
[im 61/76  soft-tissue]
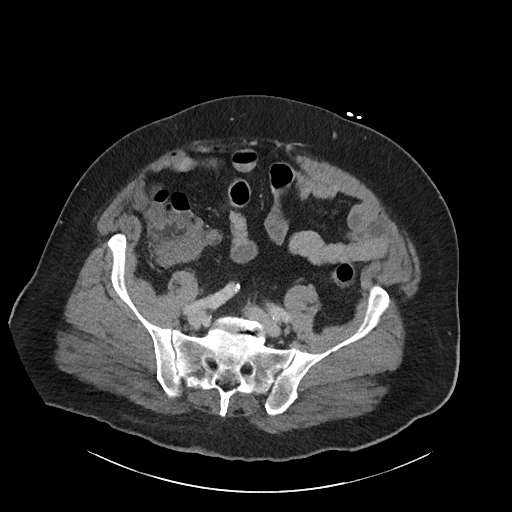
[im 66/76  soft-tissue]
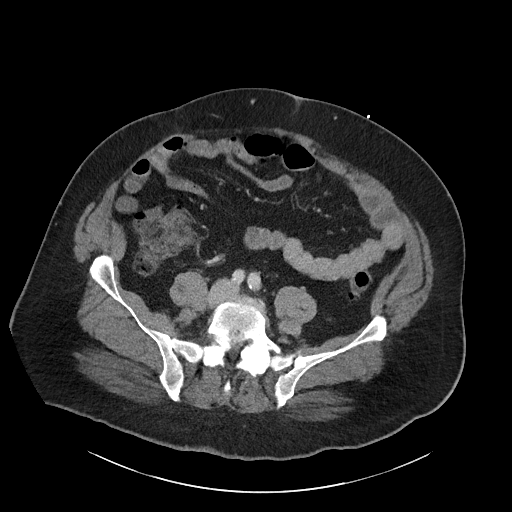
[im 71/76  soft-tissue]
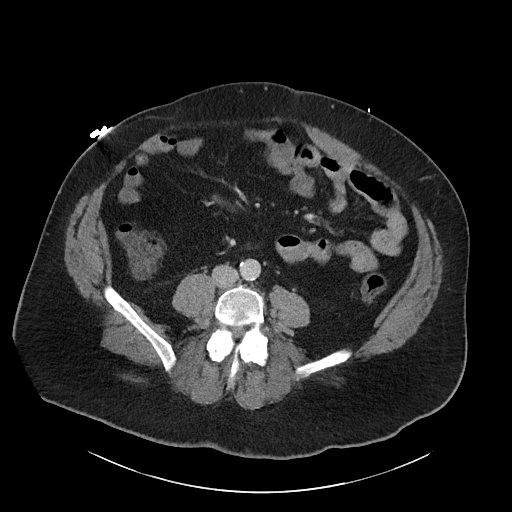

[Series 5: pelvis with 2.0 cor · coronal · 0.76mm/px · 3 of 142 slices shown]
[im 48/142  soft-tissue]
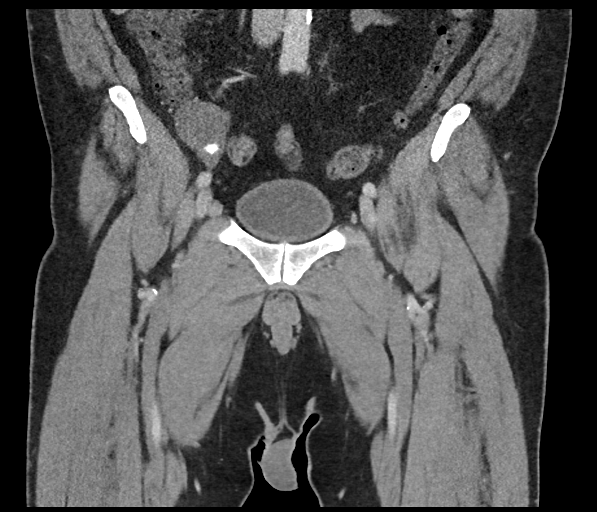
[im 63/142  soft-tissue]
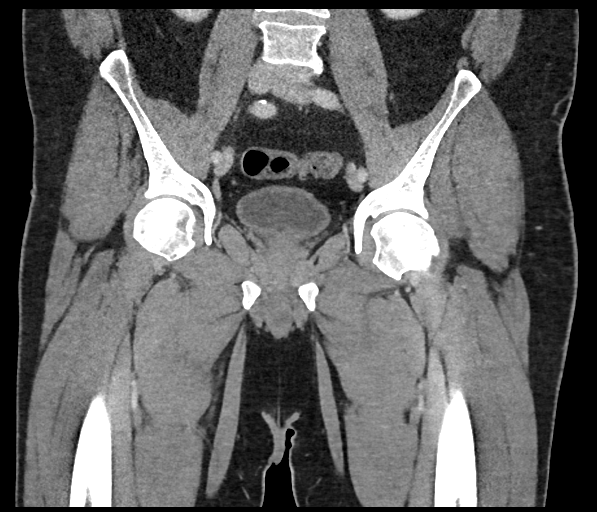
[im 79/142  soft-tissue]
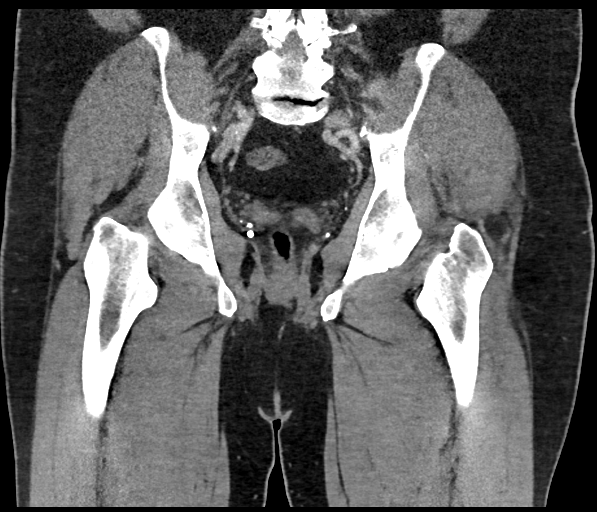

[16 of 46 positions shown; findings below may reference images not displayed]

FINDINGS: Urinary Tract: There is slight wall thickening at the right side of
the base of the bladder. The bladder is otherwise unremarkable.

Bowel: Visualized small and large bowel loops are grossly
unremarkable in appearance.

Vascular/Lymphatic: Scattered calcification is seen along the distal
abdominal aorta and its branches. The abdominal aorta is otherwise
grossly unremarkable. The inferior vena cava is grossly
unremarkable. No retroperitoneal lymphadenopathy is seen. No pelvic
sidewall lymphadenopathy is identified.

Reproductive:  The prostate remains normal in size.

Other: There is asymmetric soft tissue inflammation at the right
side of the gluteal cleft, with subcutaneous soft tissue edema
tracking along the right buttocks. No well defined abscess is seen
at this time.

Musculoskeletal: No acute osseous abnormalities are identified.
Vacuum phenomenon and facet disease is noted at the lower lumbar
spine. The visualized musculature is unremarkable in appearance.
IMPRESSION: 1. Asymmetric soft tissue inflammation at the right side of the
gluteal cleft, with subcutaneous soft tissue edema tracking along
the right buttocks. No well-defined abscess seen at this time.
2. Slight wall thickening at the right side of the base of the
bladder. This could reflect debris. Cystoscopy could be considered
to exclude underlying mass.

Aortic Atherosclerosis ([id number].T).

## 2019-11-03 ENCOUNTER — Encounter (HOSPITAL_COMMUNITY): Payer: Self-pay | Admitting: Radiology

## 2019-11-03 ENCOUNTER — Emergency Department (HOSPITAL_COMMUNITY): Payer: BC Managed Care – PPO

## 2019-11-03 ENCOUNTER — Emergency Department (HOSPITAL_COMMUNITY)
Admission: EM | Admit: 2019-11-03 | Discharge: 2019-11-03 | Disposition: A | Discharge status: Home / Self Care | Payer: BC Managed Care – PPO | Attending: Emergency Medicine | Admitting: Emergency Medicine

## 2019-11-03 ENCOUNTER — Other Ambulatory Visit: Payer: Self-pay

## 2019-11-03 DIAGNOSIS — C3490 Malignant neoplasm of unspecified part of unspecified bronchus or lung: Secondary | ICD-10-CM

## 2019-11-03 DIAGNOSIS — Z79899 Other long term (current) drug therapy: Secondary | ICD-10-CM | POA: Diagnosis not present

## 2019-11-03 DIAGNOSIS — R05 Cough: Secondary | ICD-10-CM | POA: Insufficient documentation

## 2019-11-03 DIAGNOSIS — Z20822 Contact with and (suspected) exposure to covid-19: Secondary | ICD-10-CM | POA: Diagnosis not present

## 2019-11-03 DIAGNOSIS — R059 Cough, unspecified: Secondary | ICD-10-CM

## 2019-11-03 DIAGNOSIS — Z87891 Personal history of nicotine dependence: Secondary | ICD-10-CM | POA: Insufficient documentation

## 2019-11-03 LAB — BLOOD GAS, VENOUS
Acid-Base Excess: 4.5 mmol/L — ABNORMAL HIGH (ref 0.0–2.0)
Bicarbonate: 26.6 mmol/L (ref 20.0–28.0)
FIO2: 21
O2 Saturation: 62 %
Patient temperature: 37.2
pCO2, Ven: 43.6 mmHg — ABNORMAL LOW (ref 44.0–60.0)
pH, Ven: 7.433 — ABNORMAL HIGH (ref 7.250–7.430)
pO2, Ven: 35.1 mmHg (ref 32.0–45.0)

## 2019-11-03 LAB — BASIC METABOLIC PANEL
Anion gap: 13 (ref 5–15)
BUN: 12 mg/dL (ref 6–20)
CO2: 25 mmol/L (ref 22–32)
Calcium: 8.7 mg/dL — ABNORMAL LOW (ref 8.9–10.3)
Chloride: 100 mmol/L (ref 98–111)
Creatinine, Ser: 0.92 mg/dL (ref 0.61–1.24)
GFR calc Af Amer: >60 mL/min (ref >60)
GFR calc non Af Amer: >60 mL/min (ref >60)
Glucose, Bld: 83 mg/dL (ref 70–99)
Potassium: 3.3 mmol/L — ABNORMAL LOW (ref 3.5–5.1)
Sodium: 138 mmol/L (ref 135–145)

## 2019-11-03 LAB — CBC WITH DIFFERENTIAL/PLATELET
Abs Immature Granulocytes: 0.02 10*3/uL (ref 0.00–0.07)
Basophils Absolute: 0 10*3/uL (ref 0.0–0.1)
Basophils Relative: 0 %
Eosinophils Absolute: 0 10*3/uL (ref 0.0–0.5)
Eosinophils Relative: 1 %
HCT: 37 % — ABNORMAL LOW (ref 39.0–52.0)
Hemoglobin: 12.1 g/dL — ABNORMAL LOW (ref 13.0–17.0)
Immature Granulocytes: 0 %
Lymphocytes Relative: 39 %
Lymphs Abs: 1.9 10*3/uL (ref 0.7–4.0)
MCH: 29.3 pg (ref 26.0–34.0)
MCHC: 32.7 g/dL (ref 30.0–36.0)
MCV: 89.6 fL (ref 80.0–100.0)
Monocytes Absolute: 0.9 10*3/uL (ref 0.1–1.0)
Monocytes Relative: 19 %
Neutro Abs: 2 10*3/uL (ref 1.7–7.7)
Neutrophils Relative %: 41 %
Platelets: 161 10*3/uL (ref 150–400)
RBC: 4.13 MIL/uL — ABNORMAL LOW (ref 4.22–5.81)
RDW: 17.2 % — ABNORMAL HIGH (ref 11.5–15.5)
WBC: 4.9 10*3/uL (ref 4.0–10.5)
nRBC: 0 % (ref 0.0–0.2)

## 2019-11-03 NOTE — ED Triage Notes (Addendum)
Patient presents to the ED via [facility] due to Shortness of breath beginning at 1330 today. Patient was found to be at 85% room air upon EMS arrival.   Patient was initially started 15 liters non re breather then is now weaned to 3LPM nasal cannula.  Patient is not on home 02.  Patient was diagnosed with stage 4 lung cancer in November, and seen at [facility].  Patient states he has a productive cough since November.

## 2019-11-03 NOTE — Discharge Instructions (Addendum)
Make sure that you are getting plenty of rest and drinking a lot of fluids.  Use Mucinex DM for cough and mucus congestion.  Follow-up with your oncologist and primary care doctor for problems.

## 2019-11-03 NOTE — ED Provider Notes (Signed)
[facility] Provider Note   CSN: [id number] Arrival date & time: 11/03/19  1528     History Chief Complaint  Patient presents with  . Shortness of Breath    Charles Moody is a 54 y.o. male.  HPI He reports rather sudden onset of shortness of breath associated with cough which was producing mucus, about 2 hours prior to arrival. He was treated seen by EMS, who put him on oxygen, facemask, then transitioned into nasal cannula.  Vital signs and oxygenation remained stable during transport.  He did not receive additional treatment.  He is currently being treated with chemotherapy for lung cancer.  He denies fever, chills, cough, chest pain, weakness or dizziness.  He is an albuterol inhaler, occasionally.  He does not have known COPD or asthma.  No known sick exposures.  There are no other known modifying factors.    Past Medical History:  Diagnosis Date  . Cellulitis and abscess of buttock    gluteal region  . Chicken pox   . Chronic bronchitis ([place])    "kicked off by allergies to pollen"    Patient Active Problem List   Diagnosis Date Noted  . Normocytic anemia 01/11/2018  . Hyperglycemia 01/11/2018  . Obesity, Class III, BMI 40-49.9 (morbid obesity) ([place]) 01/11/2018  . Abscess and cellulitis of gluteal region 01/10/2018  . Sepsis ([place]) 01/10/2018    Past Surgical History:  Procedure Laterality Date  . APPENDECTOMY  1972  . UMBILICAL HERNIA REPAIR  2016       Family History  Problem Relation Age of Onset  . Arthritis Mother   . Diabetes Mother   . Arthritis Father     Social History   Tobacco Use  . Smoking status: Former Smoker    Packs/day: 1.50    Years: 36.00    Pack years: 54.00    Types: Cigarettes    Quit date: 2017    Years since quitting: 4.0  . Smokeless tobacco: Never Used  Substance Use Topics  . Alcohol use: Yes    Comment: 01/11/2018 "3 beers/month; if that"  . Drug use: Never    Home Medications Prior to  Admission medications   Medication Sig Start Date End Date Taking? Authorizing Provider  amoxicillin-clavulanate (AUGMENTIN) 875-125 MG tablet Take 1 tablet by mouth 2 (two) times daily. 01/13/18   Debbe Odea, MD  doxycycline (ADOXA) 100 MG tablet Take 1 tablet (100 mg total) by mouth 2 (two) times daily. 01/13/18   Debbe Odea, MD  senna-docusate (SENOKOT-S) 8.6-50 MG tablet Take 1 tablet by mouth at bedtime as needed for mild constipation. 01/13/18   Debbe Odea, MD    Allergies    Patient has no known allergies.  Review of Systems   Review of Systems  All other systems reviewed and are negative.   Physical Exam Updated Vital Signs There were no vitals taken for this visit.  Physical Exam Vitals and nursing note reviewed.  Constitutional:      General: He is not in acute distress.    Appearance: He is well-developed. He is obese. He is not ill-appearing, toxic-appearing or diaphoretic.  HENT:     Head: Normocephalic and atraumatic.     Right Ear: External ear normal.     Left Ear: External ear normal.  Eyes:     Conjunctiva/sclera: Conjunctivae normal.     Pupils: Pupils are equal, round, and reactive to light.  Neck:     Trachea: Phonation normal.  Cardiovascular:  Rate and Rhythm: Normal rate and regular rhythm.     Heart sounds: Normal heart sounds.  Pulmonary:     Effort: Pulmonary effort is normal.     Comments: Breath sounds decreased bilaterally, symmetrically, possibly due to obese body habitus.  No audible wheezes, rales or rhonchi.  No increased work of breathing. Abdominal:     General: There is no distension.     Palpations: Abdomen is soft.     Tenderness: There is no abdominal tenderness.  Musculoskeletal:        General: No swelling or tenderness. Normal range of motion.     Cervical back: Normal range of motion and neck supple.  Skin:    General: Skin is warm and dry.  Neurological:     Mental Status: He is alert and oriented to person, place,  and time.     Cranial Nerves: No cranial nerve deficit.     Sensory: No sensory deficit.     Motor: No abnormal muscle tone.     Coordination: Coordination normal.  Psychiatric:        Mood and Affect: Mood normal.        Behavior: Behavior normal.        Thought Content: Thought content normal.        Judgment: Judgment normal.     ED Results / Procedures / Treatments   Labs (all labs ordered are listed, but only abnormal results are displayed) Labs Reviewed - No data to display  EKG None  Radiology No results found.  Procedures Procedures (including critical care time)  Medications Ordered in ED Medications - No data to display  ED Course  I have reviewed the triage vital signs and the nursing notes.  Pertinent labs & imaging results that were available during my care of the patient were reviewed by me and considered in my medical decision making (see chart for details).    MDM Rules/Calculators/A&P                       Patient Vitals for the past 24 hrs:  BP Temp Temp src Pulse Resp SpO2 Height Weight  11/03/19 1541 -- -- -- -- -- -- 5\' 9"  (1.753 m) 122.9 kg  11/03/19 1540 (!) 161/102 99 F (37.2 C) Oral (!) 117 (!) 24 98 % -- --    CT chest result from 09/26/2019, [facility]: LUNGS/PLEURA: There are innumerable bilateral pulmonary nodules. The largest mass in the right upper lobe appear stable in size measuring 3.8 x 2.6 cm image 20. However, many of the innumerable bilateral nodules appear larger in size. For example, a right lower lobe  nodule which previously measured 6 mm now measures 12 mm image 61.  10:30 PM Reevaluation with update and discussion. After initial assessment and treatment, an updated evaluation reveals he remains comfortable sitting up in no distress.  Oxygenation 96% on room air.  Findings discussed with the patient and all questions were answered. Daleen Bo   Medical Decision Making: Cough, with transient shortness of breath,  likely related to mucous plugging.  Patient with lung cancer and metastatic nodules noted on chest imaging.  This is similar to CT scan from [place], available through this EMR.  Doubt serious bacterial infection, metabolic instability or impending vascular collapse.  Charles Moody was evaluated in Emergency Department on 11/03/2019 for the symptoms described in the history of present illness. He was evaluated in the context of the global COVID-19 pandemic,  which necessitated consideration that the patient might be at risk for infection with the SARS-CoV-2 virus that causes COVID-19. Institutional protocols and algorithms that pertain to the evaluation of patients at risk for COVID-19 are in a state of rapid change based on information released by regulatory bodies including the CDC and federal and state organizations. These policies and algorithms were followed during the patient's care in the ED.  CRITICAL CARE-no Performed by: Daleen Bo  Nursing Notes Reviewed/ Care Coordinated Applicable Imaging Reviewed Interpretation of Laboratory Data incorporated into ED treatment  The patient appears reasonably screened and/or stabilized for discharge and I doubt any other medical condition or other [facility] requiring further screening, evaluation, or treatment in the ED at this time prior to discharge.  Plan: Home Medications-continue usual, mucolytic of choice; Home Treatments-rest, fluids; return here if the recommended treatment, does not improve the symptoms; Recommended follow up-PCP and oncology.   Final Clinical Impression(s) / ED Diagnoses Final diagnoses:  None    Rx / DC Orders ED Discharge Orders    None       Daleen Bo, MD 11/03/19 2231

## 2019-11-03 NOTE — ED Notes (Signed)
Pt ambulated to bathroom on room air. On return to room O2 sats at 95-96%.

## 2019-11-04 LAB — SARS CORONAVIRUS 2 (TAT 6-24 HRS): SARS Coronavirus 2: NEGATIVE

## 2020-04-28 DEATH — deceased

## 2021-10-08 IMAGING — DX DG CHEST 1V PORT
1 series · 1 of 1 positions shown · non-contrast
Comparison: Radiograph 01/11/2018

CLINICAL DATA: Shortness of breath

EXAM:
PORTABLE CHEST 1 VIEW

[chest ap]
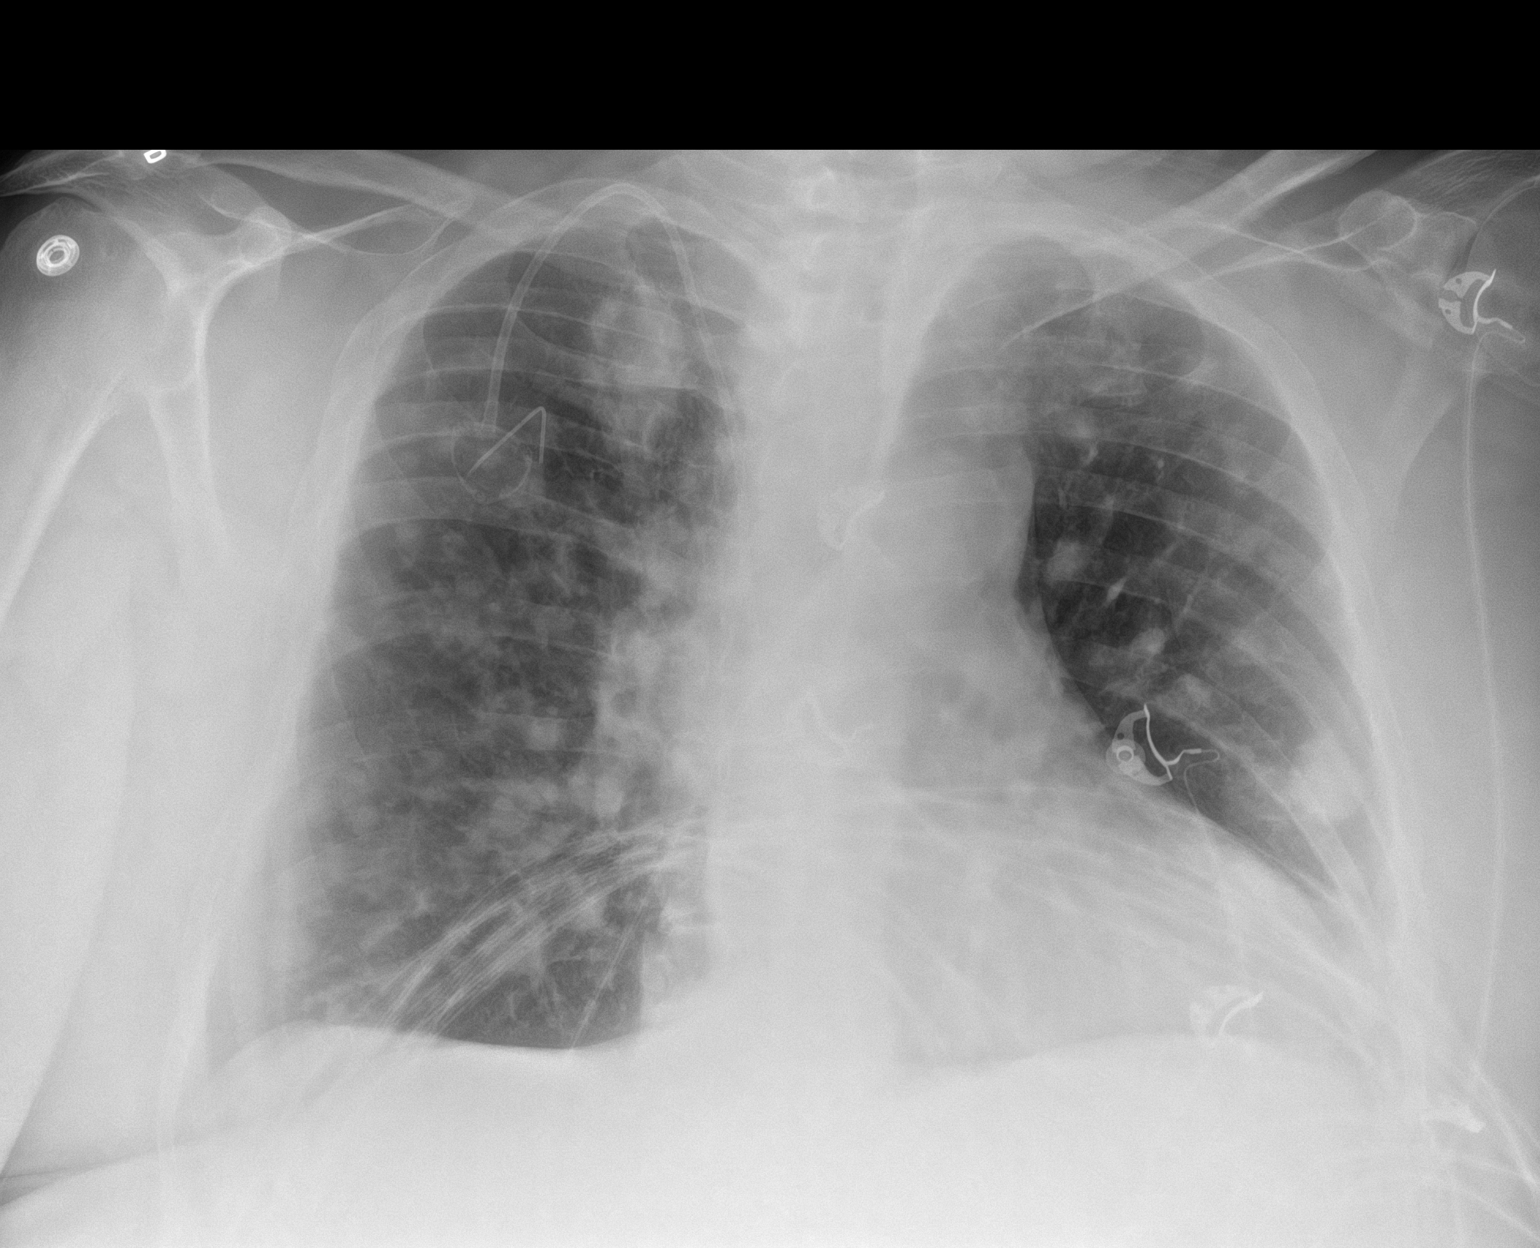

[1 of 1 positions shown; findings below may reference images not displayed]

FINDINGS: Multifocal nodular opacities throughout both lungs are compatible
with metastatic disease in the setting of stage IV lung cancer. No
convincing features of edema, pneumothorax or effusion. An accessed
right IJ approach Port-A-Cath tip terminates at the superior
cavoatrial junction. The cardiomediastinal contours are similar to
prior accounting for differences in technique. No acute osseous or
soft tissue abnormality. Telemetry leads overlie the chest
IMPRESSION: Multifocal nodular opacities throughout both lungs are compatible
with metastatic disease in the setting of stage IV lung cancer. A
superimposed infectious consolidation would be difficult to exclude
on radiography alone as this could appear similar to the nodular
opacities presumed to be metastatic lesions.
